# Patient Record
Sex: Male | Born: 1955
Health system: Southern US, Community
[De-identification: ages and names within clinical notes are randomized; demographics above are authoritative.]

## PROBLEM LIST (undated history)

## (undated) DIAGNOSIS — C3490 Malignant neoplasm of unspecified part of unspecified bronchus or lung: Secondary | ICD-10-CM

## (undated) DIAGNOSIS — C801 Malignant (primary) neoplasm, unspecified: Secondary | ICD-10-CM

## (undated) DIAGNOSIS — M1990 Unspecified osteoarthritis, unspecified site: Secondary | ICD-10-CM

## (undated) DIAGNOSIS — K219 Gastro-esophageal reflux disease without esophagitis: Secondary | ICD-10-CM

## (undated) DIAGNOSIS — R972 Elevated prostate specific antigen [PSA]: Secondary | ICD-10-CM

## (undated) DIAGNOSIS — Z9221 Personal history of antineoplastic chemotherapy: Secondary | ICD-10-CM

## (undated) DIAGNOSIS — J449 Chronic obstructive pulmonary disease, unspecified: Secondary | ICD-10-CM

## (undated) DIAGNOSIS — J439 Emphysema, unspecified: Secondary | ICD-10-CM

## (undated) HISTORY — PX: TONSILLECTOMY: SUR1361

## (undated) HISTORY — PX: COLONOSCOPY: SHX174

---

## 2011-05-14 ENCOUNTER — Emergency Department (INDEPENDENT_AMBULATORY_CARE_PROVIDER_SITE_OTHER): Payer: Worker's Compensation

## 2011-05-14 ENCOUNTER — Emergency Department (HOSPITAL_BASED_OUTPATIENT_CLINIC_OR_DEPARTMENT_OTHER): Payer: Worker's Compensation

## 2011-05-14 ENCOUNTER — Emergency Department (HOSPITAL_BASED_OUTPATIENT_CLINIC_OR_DEPARTMENT_OTHER)
Admission: EM | Admit: 2011-05-14 | Discharge: 2011-05-14 | Disposition: A | Discharge status: Home / Self Care | Payer: Worker's Compensation | Attending: Emergency Medicine | Admitting: Emergency Medicine

## 2011-05-14 DIAGNOSIS — Y92009 Unspecified place in unspecified non-institutional (private) residence as the place of occurrence of the external cause: Secondary | ICD-10-CM | POA: Insufficient documentation

## 2011-05-14 DIAGNOSIS — F17200 Nicotine dependence, unspecified, uncomplicated: Secondary | ICD-10-CM | POA: Insufficient documentation

## 2011-05-14 DIAGNOSIS — S02640A Fracture of ramus of mandible, unspecified side, initial encounter for closed fracture: Secondary | ICD-10-CM | POA: Insufficient documentation

## 2011-05-14 DIAGNOSIS — R51 Headache: Secondary | ICD-10-CM

## 2011-05-14 DIAGNOSIS — R220 Localized swelling, mass and lump, head: Secondary | ICD-10-CM

## 2016-11-28 DIAGNOSIS — F17200 Nicotine dependence, unspecified, uncomplicated: Secondary | ICD-10-CM | POA: Diagnosis not present

## 2016-11-28 DIAGNOSIS — R030 Elevated blood-pressure reading, without diagnosis of hypertension: Secondary | ICD-10-CM | POA: Diagnosis not present

## 2016-11-28 DIAGNOSIS — J441 Chronic obstructive pulmonary disease with (acute) exacerbation: Secondary | ICD-10-CM | POA: Diagnosis not present

## 2017-02-07 DIAGNOSIS — H5203 Hypermetropia, bilateral: Secondary | ICD-10-CM | POA: Diagnosis not present

## 2017-02-07 DIAGNOSIS — H524 Presbyopia: Secondary | ICD-10-CM | POA: Diagnosis not present

## 2017-02-07 DIAGNOSIS — H52221 Regular astigmatism, right eye: Secondary | ICD-10-CM | POA: Diagnosis not present

## 2017-07-03 ENCOUNTER — Ambulatory Visit (HOSPITAL_COMMUNITY)
Admission: EM | Admit: 2017-07-03 | Discharge: 2017-07-03 | Disposition: A | Discharge status: Home / Self Care | Payer: 59 | Attending: Family Medicine | Admitting: Family Medicine

## 2017-07-03 ENCOUNTER — Ambulatory Visit (INDEPENDENT_AMBULATORY_CARE_PROVIDER_SITE_OTHER): Payer: 59

## 2017-07-03 ENCOUNTER — Encounter (HOSPITAL_COMMUNITY): Payer: Self-pay | Admitting: *Deleted

## 2017-07-03 DIAGNOSIS — S62607A Fracture of unspecified phalanx of left little finger, initial encounter for closed fracture: Secondary | ICD-10-CM | POA: Diagnosis not present

## 2017-07-03 DIAGNOSIS — M7989 Other specified soft tissue disorders: Secondary | ICD-10-CM | POA: Diagnosis not present

## 2017-07-03 MED ORDER — HYDROCODONE-ACETAMINOPHEN 5-325 MG PO TABS: 2.0000 | ORAL_TABLET | ORAL | 0 refills | Status: DC | PRN

## 2017-07-03 NOTE — ED Provider Notes (Signed)
  [facility]   [id number] 07/03/17 Arrival Time: 3335  ASSESSMENT & PLAN:  1. Closed nondisplaced fracture of phalanx of left little finger, unspecified phalanx, initial encounter     Meds ordered this encounter  Medications  . HYDROcodone-acetaminophen (NORCO/VICODIN) 5-325 MG tablet    Sig: Take 2 tablets by mouth every 4 (four) hours as needed.    Dispense:  6 tablet    Refill:  0    Order Specific Question:   Supervising Provider    Answer:   Vanessa Kick [[id number]]   Static Splint left pinky finger. Referral to Orthopedics, call for an appointment tomorrow.  Reviewed expectations re: course of current medical issues. Questions answered. Outlined signs and symptoms indicating need for more acute intervention. Patient verbalized understanding. After Visit Summary given.   SUBJECTIVE:  Ricky Arellano is a 61 y.o. male who presents with complaint of   ROS: As per HPI.   OBJECTIVE:  Vitals:   07/03/17 1850  BP: (!) 142/76  Pulse: 82  Resp: 14  Temp: 98.6 F (37 C)  TempSrc: Oral  SpO2: 100%     General appearance: alert; no distressNeck: supple Lungs: clear to auscultation bilaterally Heart: regular rate and rhythm Abdomen: soft, non-tender; bowel sounds normal; no masses or organomegaly; no guarding or rebound tenderness MS - TTP left pinky at MIP joint Neurologic: normal symmetric reflexes; normal gait Psychological:  alert and cooperative; normal mood and affect  No results found for this or any previous visit.  Labs Reviewed - No data to display  Dg Finger Little Left  Result Date: 07/03/2017 CLINICAL DATA:  Pain and swelling EXAM: LEFT LITTLE FINGER 2+V COMPARISON:  None. FINDINGS: No gross fracture or dislocation. Tiny cortical fragment seen at the base of the middle phalanx anteriorly suggests avulsion fracture. Loss of joint space noted in the IP joints. Tiny radiodense foreign body noted in the anterior soft tissues of the finger at  about the level of the proximal phalangeal neck. IMPRESSION: No gross fracture dislocation. There is a tiny cortical fragment anterior to the base of the middle phalanx raising the question of volar plate avulsion injury. Tiny radiopaque soft tissue foreign body anteriorly in the proximal finger. Electronically Signed   By: Misty Stanley M.D.   On: 07/03/2017 19:07    No Known Allergies  PMHx, SurgHx, SocialHx, Medications, and Allergies were reviewed in the Visit Navigator and updated as appropriate.       Lysbeth Penner, [place] 07/03/17 [phone]

## 2017-07-03 NOTE — ED Triage Notes (Signed)
PT  BENT  L  PINKY  BACK   WOKE  UP   NEXT  AM  WITH  SWELLING  AND  DECREASED      ROM

## 2017-07-10 DIAGNOSIS — H04123 Dry eye syndrome of bilateral lacrimal glands: Secondary | ICD-10-CM | POA: Diagnosis not present

## 2017-07-10 DIAGNOSIS — H2513 Age-related nuclear cataract, bilateral: Secondary | ICD-10-CM | POA: Diagnosis not present

## 2017-07-17 DIAGNOSIS — M79645 Pain in left finger(s): Secondary | ICD-10-CM | POA: Diagnosis not present

## 2017-07-17 DIAGNOSIS — M25649 Stiffness of unspecified hand, not elsewhere classified: Secondary | ICD-10-CM | POA: Diagnosis not present

## 2017-11-21 DIAGNOSIS — C3490 Malignant neoplasm of unspecified part of unspecified bronchus or lung: Secondary | ICD-10-CM

## 2017-11-21 HISTORY — DX: Malignant neoplasm of unspecified part of unspecified bronchus or lung: C34.90

## 2018-01-26 ENCOUNTER — Encounter (HOSPITAL_COMMUNITY): Payer: Self-pay | Admitting: Emergency Medicine

## 2018-01-26 ENCOUNTER — Ambulatory Visit (HOSPITAL_COMMUNITY)
Admission: EM | Admit: 2018-01-26 | Discharge: 2018-01-26 | Disposition: A | Discharge status: Home / Self Care | Payer: 59 | Attending: Internal Medicine | Admitting: Internal Medicine

## 2018-01-26 DIAGNOSIS — R69 Illness, unspecified: Secondary | ICD-10-CM | POA: Diagnosis not present

## 2018-01-26 DIAGNOSIS — J111 Influenza due to unidentified influenza virus with other respiratory manifestations: Secondary | ICD-10-CM | POA: Diagnosis not present

## 2018-01-26 MED ORDER — IPRATROPIUM BROMIDE 0.06 % NA SOLN: 2.0000 | Freq: Four times a day (QID) | NASAL | 0 refills | Status: DC

## 2018-01-26 MED ORDER — HYDROCOD POLST-CPM POLST ER 10-8 MG/5ML PO SUER: 5.0000 mL | Freq: Every evening | ORAL | 0 refills | Status: DC | PRN

## 2018-01-26 MED ORDER — BENZONATATE 100 MG PO CAPS: 100.0000 mg | ORAL_CAPSULE | Freq: Three times a day (TID) | ORAL | 0 refills | Status: DC

## 2018-01-26 MED ORDER — FLUTICASONE PROPIONATE 50 MCG/ACT NA SUSP: 2.0000 | Freq: Every day | NASAL | 0 refills | Status: DC

## 2018-01-26 NOTE — ED Provider Notes (Signed)
[facility]    CSN: [id number] Arrival date & time: 01/26/18  1051     History   Chief Complaint Chief Complaint  Patient presents with  . Fever  . Cough    HPI Ricky Arellano is a 62 y.o. male.   62 year old male comes in for 3 day history of flulike symptoms.  States has had fever, joint pain, productive cough, sore throat, rhinorrhea, nasal congestion. Subjective fever with chills. otc tylenol, motrin, cold medicine without relief. Last dose of antipyretic this morning. Chest soreness when coughing. Denies shortness of breath, wheezing. Current every day smoker, 1ppd, states smoking for more than 40 years.       History reviewed. No pertinent past medical history.  There are no active problems to display for this patient.   History reviewed. No pertinent surgical history.     Home Medications    Prior to Admission medications   Medication Sig Start Date End Date Taking? Authorizing Provider  benzonatate (TESSALON) 100 MG capsule Take 1 capsule (100 mg total) by mouth every 8 (eight) hours. 01/26/18   Tasia Catchings, Carsin Randazzo V, PA-C  chlorpheniramine-HYDROcodone (TUSSIONEX PENNKINETIC ER) 10-8 MG/5ML SUER Take 5 mLs by mouth at bedtime as needed for cough. 01/26/18   Tasia Catchings, Jailan Trimm V, PA-C  fluticasone (FLONASE) 50 MCG/ACT nasal spray Place 2 sprays into both nostrils daily. 01/26/18   Tasia Catchings, Lotta [place]enfield V, PA-C  ipratropium (ATROVENT) 0.06 % nasal spray Place 2 sprays into both nostrils 4 (four) times daily. 01/26/18   Ok Edwards, PA-C    Family History No family history on file.  Social History Social History   Tobacco Use  . Smoking status: Current Some Day Smoker  . Smokeless tobacco: Never Used  Substance Use Topics  . Alcohol use: No  . Drug use: No     Allergies   Patient has no known allergies.   Review of Systems Review of Systems  Reason unable to perform ROS: See HPI as above.     Physical Exam Triage Vital Signs ED Triage Vitals  Enc Vitals Group     BP 01/26/18  1127 130/73     Pulse Rate 01/26/18 1127 (!) 110     Resp 01/26/18 1127 18     Temp 01/26/18 1127 99.7 F (37.6 C)     Temp src --      SpO2 01/26/18 1127 93 %     Weight --      Height --      Head Circumference --      Peak Flow --      Pain Score 01/26/18 1129 8     Pain Loc --      Pain Edu? --      Excl. in [place]? --    No data found.  Updated Vital Signs BP 130/73   Pulse (!) 110   Temp 99.7 F (37.6 C)   Resp 18   SpO2 93%   Physical Exam  Constitutional: He is oriented to person, place, and time. He appears well-developed and well-nourished. No distress.  HENT:  Head: Normocephalic and atraumatic.  Right Ear: Tympanic membrane, external ear and ear canal normal. Tympanic membrane is not erythematous and not bulging.  Left Ear: Tympanic membrane, external ear and ear canal normal. Tympanic membrane is not erythematous and not bulging.  Nose: Mucosal edema and rhinorrhea present. Right sinus exhibits maxillary sinus tenderness. Right sinus exhibits no frontal sinus tenderness. Left sinus exhibits maxillary sinus tenderness.  Left sinus exhibits no frontal sinus tenderness.  Mouth/Throat: Uvula is midline, oropharynx is clear and moist and mucous membranes are normal. No tonsillar exudate.  Eyes: Conjunctivae are normal. Pupils are equal, round, and reactive to light.  Neck: Normal range of motion. Neck supple.  Cardiovascular: Normal rate, regular rhythm and normal heart sounds. Exam reveals no gallop and no friction rub.  No murmur heard. Pulmonary/Chest: Effort normal and breath sounds normal. No stridor. He has no decreased breath sounds. He has no wheezes. He has no rhonchi. He has no rales.  Lymphadenopathy:    He has no cervical adenopathy.  Neurological: He is alert and oriented to person, place, and time.  Skin: Skin is warm and dry.  Psychiatric: He has a normal mood and affect. His behavior is normal. Judgment normal.    UC Treatments / Results  Labs (all  labs ordered are listed, but only abnormal results are displayed) Labs Reviewed - No data to display  EKG  EKG Interpretation None       Radiology No results found.  Procedures Procedures (including critical care time)  Medications Ordered in UC Medications - No data to display   Initial Impression / Assessment and Plan / UC Course  I have reviewed the triage vital signs and the nursing notes.  Pertinent labs & imaging results that were available during my care of the patient were reviewed by me and considered in my medical decision making (see chart for details).    Patient with flulike symptoms, outside of range for Tamiflu.  Will provide symptomatic treatment as needed. Push fluids. Return precautions given.    Final Clinical Impressions(s) / UC Diagnoses   Final diagnoses:  Influenza-like illness    ED Discharge Orders        Ordered    fluticasone (FLONASE) 50 MCG/ACT nasal spray  Daily     01/26/18 1228    ipratropium (ATROVENT) 0.06 % nasal spray  4 times daily     01/26/18 1228    benzonatate (TESSALON) 100 MG capsule  Every 8 hours     01/26/18 1228    chlorpheniramine-HYDROcodone (TUSSIONEX PENNKINETIC ER) 10-8 MG/5ML SUER  At bedtime PRN     01/26/18 1228       Controlled Substance Prescriptions [place] Controlled Substance Registry consulted? Yes, I have consulted the [place] Controlled Substances Registry for this patient, and feel the risk/benefit ratio today is favorable for proceeding with this prescription for a controlled substance.   Ok Edwards, PA-C 01/26/18 1234

## 2018-01-26 NOTE — ED Triage Notes (Signed)
Pt c/o fever, joints achy, flu symptoms for several days. Pt taking tylenol and ibuprofen for symptoms. Coughing.

## 2018-01-26 NOTE — Discharge Instructions (Signed)
Tessalon for cough. Tussionex at night for cough. Start flonase, atrovent nasal spray for nasal congestion/drainage. You can use over the counter nasal saline rinse such as neti pot for nasal congestion. Keep hydrated, your urine should be clear to pale yellow in color. Tylenol/motrin for fever and pain. Monitor for any worsening of symptoms, chest pain, shortness of breath, wheezing, swelling of the throat, follow up for reevaluation.   For sore throat try using a honey-based tea. Use 3 teaspoons of honey with juice squeezed from half lemon. Place shaved pieces of ginger into 1/2-1 cup of water and warm over stove top. Then mix the ingredients and repeat every 4 hours as needed.

## 2018-08-06 ENCOUNTER — Ambulatory Visit (INDEPENDENT_AMBULATORY_CARE_PROVIDER_SITE_OTHER): Payer: 59

## 2018-08-06 ENCOUNTER — Other Ambulatory Visit: Payer: Self-pay

## 2018-08-06 ENCOUNTER — Encounter (HOSPITAL_COMMUNITY): Payer: Self-pay | Admitting: Emergency Medicine

## 2018-08-06 ENCOUNTER — Ambulatory Visit (HOSPITAL_COMMUNITY)
Admission: EM | Admit: 2018-08-06 | Discharge: 2018-08-06 | Disposition: A | Discharge status: Home / Self Care | Payer: 59 | Attending: Family Medicine | Admitting: Family Medicine

## 2018-08-06 DIAGNOSIS — R9389 Abnormal findings on diagnostic imaging of other specified body structures: Secondary | ICD-10-CM

## 2018-08-06 DIAGNOSIS — J449 Chronic obstructive pulmonary disease, unspecified: Secondary | ICD-10-CM | POA: Diagnosis not present

## 2018-08-06 DIAGNOSIS — J181 Lobar pneumonia, unspecified organism: Secondary | ICD-10-CM

## 2018-08-06 DIAGNOSIS — J189 Pneumonia, unspecified organism: Secondary | ICD-10-CM

## 2018-08-06 MED ORDER — AZITHROMYCIN 250 MG PO TABS: 250.0000 mg | ORAL_TABLET | Freq: Every day | ORAL | 0 refills | Status: AC

## 2018-08-06 MED ORDER — HYDROCOD POLST-CPM POLST ER 10-8 MG/5ML PO SUER
5.0000 mL | Freq: Every evening | ORAL | 0 refills | Status: AC | PRN
Start: 2018-08-06 — End: ?

## 2018-08-06 NOTE — Discharge Instructions (Addendum)
You appear to have a pneumonia.    Please return in two weeks for a follow up x-ray to make sure the infection has cleared.

## 2018-08-06 NOTE — ED Provider Notes (Signed)
[facility]    CSN: [id number] Arrival date & time: 08/06/18  1754     History   Chief Complaint Chief Complaint  Patient presents with  . Chills    HPI [place] SHANDELL JALLOW is a 62 y.o. male.   Fever, chills, body aches for a week.  Chills were most prominent the last 24 hours.  Patient is a smoker.  He is not short of breath.  Significantly, he has had no cough.  Left chest pain intermittently.    Patient works in [employer].     History reviewed. No pertinent past medical history.  There are no active problems to display for this patient.   History reviewed. No pertinent surgical history.     Home Medications    Prior to Admission medications   Medication Sig Start Date End Date Taking? Authorizing Provider  acetaminophen (TYLENOL) 325 MG tablet Take 650 mg by mouth every 6 (six) hours as needed.   Yes [provider]  ibuprofen (ADVIL,MOTRIN) 200 MG tablet Take 200 mg by mouth every 6 (six) hours as needed.   Yes [provider]  azithromycin (ZITHROMAX) 250 MG tablet Take 1 tablet (250 mg total) by mouth daily. Take first 2 tablets together, then 1 every day until finished. 08/06/18   Robyn Haber, MD  chlorpheniramine-HYDROcodone (TUSSIONEX PENNKINETIC ER) 10-8 MG/5ML SUER Take 5 mLs by mouth at bedtime as needed for cough. 08/06/18   Robyn Haber, MD    Family History History reviewed. No pertinent family history.  Social History Social History   Tobacco Use  . Smoking status: Current Some Day Smoker  . Smokeless tobacco: Never Used  Substance Use Topics  . Alcohol use: No  . Drug use: No     Allergies   Patient has no known allergies.   Review of Systems Review of Systems  Constitutional: Positive for chills.  HENT: Positive for congestion.   Respiratory: Positive for chest tightness. Negative for cough and wheezing.   Gastrointestinal: Negative.   Musculoskeletal: Positive for myalgias.  Neurological:  Negative.   Psychiatric/Behavioral: Negative.      Physical Exam Triage Vital Signs ED Triage Vitals  Enc Vitals Group     BP 08/06/18 1839 139/68     Pulse Rate 08/06/18 1839 82     Resp 08/06/18 1839 18     Temp 08/06/18 1839 98.6 F (37 C)     Temp Source 08/06/18 1839 Oral     SpO2 08/06/18 1839 97 %     Weight --      Height --      Head Circumference --      Peak Flow --      Pain Score 08/06/18 1835 0     Pain Loc --      Pain Edu? --      Excl. in [place]? --    No data found.  Updated Vital Signs BP 139/68 (BP Location: Right Arm)   Pulse 82   Temp 98.6 F (37 C) (Oral)   Resp 18   SpO2 97%    Physical Exam  Constitutional: He is oriented to person, place, and time. He appears well-developed and well-nourished.  HENT:  Right Ear: External ear normal.  Left Ear: External ear normal.  Mouth/Throat: Oropharynx is clear and moist.  Eyes: Pupils are equal, round, and reactive to light. Conjunctivae are normal.  Neck: Normal range of motion. Neck supple.  Cardiovascular: Normal rate and regular rhythm.  Pulmonary/Chest:  Effort normal. He has rales.  Left basilar rales  Musculoskeletal: Normal range of motion.  Neurological: He is alert and oriented to person, place, and time.  Skin: Skin is warm and dry.  Nursing note and vitals reviewed.    UC Treatments / Results  Labs (all labs ordered are listed, but only abnormal results are displayed) Labs Reviewed - No data to display  EKG None  Radiology No results found.  Procedures Procedures (including critical care time)  Medications Ordered in UC Medications - No data to display  Initial Impression / Assessment and Plan / UC Course  I have reviewed the triage vital signs and the nursing notes.  Pertinent labs & imaging results that were available during my care of the patient were reviewed by me and considered in my medical decision making (see chart for details).    Final Clinical Impressions(s)  / UC Diagnoses   Final diagnoses:  Community acquired pneumonia of left lower lobe of lung ([place])  Abnormal chest x-ray     Discharge Instructions     You appear to have a pneumonia.    Please return in two weeks for a follow up x-ray to make sure the infection has cleared.    ED Prescriptions    Medication Sig Dispense Auth. Provider   chlorpheniramine-HYDROcodone (TUSSIONEX PENNKINETIC ER) 10-8 MG/5ML SUER Take 5 mLs by mouth at bedtime as needed for cough. 60 mL Robyn Haber, MD   azithromycin (ZITHROMAX) 250 MG tablet Take 1 tablet (250 mg total) by mouth daily. Take first 2 tablets together, then 1 every day until finished. 6 tablet Robyn Haber, MD     Controlled Substance Prescriptions [place] Controlled Substance Registry consulted? Not Applicable   Robyn Haber, MD 08/06/18 1911

## 2018-08-06 NOTE — ED Triage Notes (Signed)
Fever, chills, body aches for a week.    Left chest pain intermittently.

## 2018-09-03 ENCOUNTER — Ambulatory Visit (INDEPENDENT_AMBULATORY_CARE_PROVIDER_SITE_OTHER): Payer: 59

## 2018-09-03 ENCOUNTER — Ambulatory Visit (HOSPITAL_COMMUNITY)
Admission: EM | Admit: 2018-09-03 | Discharge: 2018-09-03 | Disposition: A | Discharge status: Home / Self Care | Payer: 59 | Attending: Family Medicine | Admitting: Family Medicine

## 2018-09-03 ENCOUNTER — Other Ambulatory Visit: Payer: Self-pay

## 2018-09-03 ENCOUNTER — Encounter (HOSPITAL_COMMUNITY): Payer: Self-pay | Admitting: Emergency Medicine

## 2018-09-03 DIAGNOSIS — R0981 Nasal congestion: Secondary | ICD-10-CM

## 2018-09-03 DIAGNOSIS — R05 Cough: Secondary | ICD-10-CM

## 2018-09-03 DIAGNOSIS — Z720 Tobacco use: Secondary | ICD-10-CM | POA: Diagnosis not present

## 2018-09-03 DIAGNOSIS — Z0000 Encounter for general adult medical examination without abnormal findings: Secondary | ICD-10-CM

## 2018-09-03 DIAGNOSIS — J181 Lobar pneumonia, unspecified organism: Secondary | ICD-10-CM

## 2018-09-03 NOTE — ED Triage Notes (Signed)
Pt here for follow up xray for pneumonia he was diagnosed with last month.  Pt reports feeling better, with just some sinus congestion and cough.

## 2018-09-03 NOTE — Discharge Instructions (Signed)
Your x ray was improved from previous.  You can try Flonase nasal spray over the counter for nasal congestion and sinus pressure.  Follow up with your doctor for possibly CT scan.

## 2018-09-03 NOTE — ED Provider Notes (Signed)
[name]    CSN: [id number] Arrival date & time: 09/03/18  1134     History   Chief Complaint Chief Complaint  Patient presents with  . Follow-up  . Repeat XRay    HPI Ricky Arellano is a 62 y.o. male.   Pt is a 62 year old male that presents for recheck of pneumonia. He was seen here and treated for PNA on the 16th of September. He since has had improvement in symptoms. His only complaint today is some nasal congestion that started 2 days ago.  He still has a mild cough but not as severe as it was previously.  He is currently not take any medicines for his symptoms.  He denies any fever, chills, body aches, chest pain, shortness of breath or palpitations.  ROS per HPI      History reviewed. No pertinent past medical history.  There are no active problems to display for this patient.   History reviewed. No pertinent surgical history.     Home Medications    Prior to Admission medications   Medication Sig Start Date End Date Taking? Authorizing Provider  acetaminophen (TYLENOL) 325 MG tablet Take 650 mg by mouth every 6 (six) hours as needed.   Yes [provider]  chlorpheniramine-HYDROcodone (TUSSIONEX PENNKINETIC ER) 10-8 MG/5ML SUER Take 5 mLs by mouth at bedtime as needed for cough. 08/06/18  Yes Robyn Haber, MD  ibuprofen (ADVIL,MOTRIN) 200 MG tablet Take 200 mg by mouth every 6 (six) hours as needed.   Yes [provider]  azithromycin (ZITHROMAX) 250 MG tablet Take 1 tablet (250 mg total) by mouth daily. Take first 2 tablets together, then 1 every day until finished. 08/06/18   Robyn Haber, MD    Family History History reviewed. No pertinent family history.  Social History Social History   Tobacco Use  . Smoking status: Current Some Day Smoker  . Smokeless tobacco: Never Used  Substance Use Topics  . Alcohol use: No  . Drug use: No     Allergies   Patient has no known allergies.   Review of  Systems Review of Systems   Physical Exam Triage Vital Signs ED Triage Vitals  Enc Vitals Group     BP 09/03/18 1218 (!) 147/69     Pulse Rate 09/03/18 1218 80     Resp --      Temp 09/03/18 1218 98 F (36.7 C)     Temp Source 09/03/18 1218 Oral     SpO2 09/03/18 1218 96 %     Weight --      Height --      Head Circumference --      Peak Flow --      Pain Score 09/03/18 1219 0     Pain Loc --      Pain Edu? --      Excl. in [place]? --    No data found.  Updated Vital Signs BP (!) 147/69 (BP Location: Left Arm)   Pulse 80   Temp 98 F (36.7 C) (Oral)   SpO2 96%   Visual Acuity Right Eye Distance:   Left Eye Distance:   Bilateral Distance:    Right Eye Near:   Left Eye Near:    Bilateral Near:     Physical Exam  Constitutional: He is oriented to person, place, and time. He appears well-developed and well-nourished.  Very pleasant. Non toxic or ill appearing.   HENT:  Head: Normocephalic  and atraumatic.  Right Ear: External ear normal.  Left Ear: External ear normal.  Bilateral TMs normal.  External ears normal.  Without posterior oropharyngeal erythema, tonsillar swelling or exudates. No lesions.  Mild nasal turbinate swelling.  No lymphadenopathy.   Eyes: Conjunctivae are normal.  Neck: Normal range of motion.  Cardiovascular: Normal rate, regular rhythm and normal heart sounds.  Pulmonary/Chest: Effort normal and breath sounds normal.  Lungs clear in all fields. No dyspnea or distress. No retractions or nasal flaring.   Musculoskeletal: Normal range of motion.  Neurological: He is alert and oriented to person, place, and time.  Skin: Skin is warm and dry.  Psychiatric: He has a normal mood and affect.  Nursing note and vitals reviewed.    UC Treatments / Results  Labs (all labs ordered are listed, but only abnormal results are displayed) Labs Reviewed - No data to display  EKG None  Radiology Dg Chest 2 View  Result Date: 09/03/2018 CLINICAL  DATA:  Recent pneumonia, follow-up, still has some nasal congestion and cough, smoker, COPD EXAM: CHEST - 2 VIEW COMPARISON:  08/06/2018 FINDINGS: Normal heart size, mediastinal contours, and pulmonary vascularity. Significant chronic interstitial lung disease with an area of persistent density versus nodularity in the RIGHT upper lobe. Improved LEFT lung infiltrate. Underlying emphysematous changes. Persistent more focal interstitial changes at the medial RIGHT lung base. No pleural effusion or pneumothorax. Bones unremarkable. IMPRESSION: Improved LEFT lung pneumonia with persistent chronic interstitial lung disease changes and underlying emphysematous changes in both lungs. Persistent area of questionable nodularity versus scarring in the RIGHT upper lobe; unless patient has prior outside chest radiographs which can be obtained to confirm stability of this finding, recommend CT imaging to exclude pulmonary neoplasm. Electronically Signed   By: Lavonia Dana M.D.   On: 09/03/2018 12:51    Procedures Procedures (including critical care time)  Medications Ordered in UC Medications - No data to display  Initial Impression / Assessment and Plan / UC Course  I have reviewed the triage vital signs and the nursing notes.  Pertinent labs & imaging results that were available during my care of the patient were reviewed by me and considered in my medical decision making (see chart for details).     X-ray revealed improving left lung pneumonia. Worrisome for right upper lobe scarring versus lung nodule. Recommend CT scan for further evaluation to rule out neoplasm. Patient instructed to call his primary care provider and have them order an outpatient CT scan. Understanding and agreed to plan.  Flonase nasal spray for nasal congestion an inflammation.       Final Clinical Impressions(s) / UC Diagnoses   Final diagnoses:  Regular check-up     Discharge Instructions     Your x ray was improved  from previous.  You can try Flonase nasal spray over the counter for nasal congestion and sinus pressure.  Follow up with your doctor for possibly CT scan.     ED Prescriptions    None     Controlled Substance Prescriptions [place] Controlled Substance Registry consulted? Not Applicable   Orvan July, NP 09/03/18 1439

## 2018-11-05 DIAGNOSIS — H524 Presbyopia: Secondary | ICD-10-CM | POA: Diagnosis not present

## 2018-11-05 DIAGNOSIS — H52223 Regular astigmatism, bilateral: Secondary | ICD-10-CM | POA: Diagnosis not present

## 2018-11-05 DIAGNOSIS — H5203 Hypermetropia, bilateral: Secondary | ICD-10-CM | POA: Diagnosis not present

## 2019-01-05 ENCOUNTER — Emergency Department (HOSPITAL_BASED_OUTPATIENT_CLINIC_OR_DEPARTMENT_OTHER)
Admission: EM | Admit: 2019-01-05 | Discharge: 2019-01-05 | Disposition: A | Discharge status: Home / Self Care | Payer: 59 | Attending: Emergency Medicine | Admitting: Emergency Medicine

## 2019-01-05 ENCOUNTER — Other Ambulatory Visit: Payer: Self-pay

## 2019-01-05 ENCOUNTER — Encounter (HOSPITAL_BASED_OUTPATIENT_CLINIC_OR_DEPARTMENT_OTHER): Payer: Self-pay | Admitting: Emergency Medicine

## 2019-01-05 DIAGNOSIS — K5900 Constipation, unspecified: Secondary | ICD-10-CM | POA: Diagnosis not present

## 2019-01-05 DIAGNOSIS — F17200 Nicotine dependence, unspecified, uncomplicated: Secondary | ICD-10-CM | POA: Insufficient documentation

## 2019-01-05 DIAGNOSIS — R339 Retention of urine, unspecified: Secondary | ICD-10-CM | POA: Insufficient documentation

## 2019-01-05 DIAGNOSIS — R338 Other retention of urine: Secondary | ICD-10-CM

## 2019-01-05 LAB — BASIC METABOLIC PANEL
Anion gap: 12 (ref 5–15)
BUN: 13 mg/dL (ref 8–23)
CALCIUM: 8.9 mg/dL (ref 8.9–10.3)
CO2: 21 mmol/L — ABNORMAL LOW (ref 22–32)
Chloride: 100 mmol/L (ref 98–111)
Creatinine, Ser: 0.91 mg/dL (ref 0.61–1.24)
Glucose, Bld: 112 mg/dL — ABNORMAL HIGH (ref 70–99)
Potassium: 4.2 mmol/L (ref 3.5–5.1)
Sodium: 133 mmol/L — ABNORMAL LOW (ref 135–145)

## 2019-01-05 LAB — URINALYSIS, ROUTINE W REFLEX MICROSCOPIC
Bilirubin Urine: NEGATIVE
Glucose, UA: NEGATIVE mg/dL
KETONES UR: NEGATIVE mg/dL
Leukocytes,Ua: NEGATIVE
NITRITE: NEGATIVE
PROTEIN: NEGATIVE mg/dL
Specific Gravity, Urine: 1.02 (ref 1.005–1.030)
pH: 6 (ref 5.0–8.0)

## 2019-01-05 LAB — URINALYSIS, MICROSCOPIC (REFLEX): Squamous Epithelial / LPF: NONE SEEN (ref 0–5)

## 2019-01-05 NOTE — ED Notes (Signed)
ED PA in for rectal exam with EMT

## 2019-01-05 NOTE — ED Triage Notes (Signed)
Pt reports he has not urinated since 11am.

## 2019-01-05 NOTE — ED Provider Notes (Signed)
[facility] Provider Note   CSN: [id number] Arrival date & time: 01/05/19  1735     History   Chief Complaint Chief Complaint  Patient presents with  . Urinary Retention    HPI Ricky Arellano is a 63 y.o. male.  Patient presents with c/o urinary retention --last urination was 11 AM today.  Patient does not have a history of UTI or prostate problems.  He complains of sensation of needing to urinate and lower abdominal cramping.  Patient reports taking Benadryl last night for runny nose.  No fevers, nausea, vomiting.  Patient has recently had nonbloody diarrhea and then today has been more constipated.  He has some rectal pain associated with hemorrhoids.  Onset of symptoms gradual.  Course is worsening.  Nothing makes symptoms better or worse.     History reviewed. No pertinent past medical history.  There are no active problems to display for this patient.   Past Surgical History:  Procedure Laterality Date  . TONSILLECTOMY          Home Medications    Prior to Admission medications   Medication Sig Start Date End Date Taking? Authorizing Provider  acetaminophen (TYLENOL) 325 MG tablet Take 650 mg by mouth every 6 (six) hours as needed.    [provider]  azithromycin (ZITHROMAX) 250 MG tablet Take 1 tablet (250 mg total) by mouth daily. Take first 2 tablets together, then 1 every day until finished. 08/06/18   Robyn Haber, MD  chlorpheniramine-HYDROcodone (TUSSIONEX PENNKINETIC ER) 10-8 MG/5ML SUER Take 5 mLs by mouth at bedtime as needed for cough. 08/06/18   Robyn Haber, MD  ibuprofen (ADVIL,MOTRIN) 200 MG tablet Take 200 mg by mouth every 6 (six) hours as needed.    [provider]    Family History No family history on file.  Social History Social History   Tobacco Use  . Smoking status: Current Some Day Smoker  . Smokeless tobacco: Never Used  Substance Use Topics  . Alcohol use: No  . Drug use:  No     Allergies   Patient has no known allergies.   Review of Systems Review of Systems  Constitutional: Negative for fever.  HENT: Negative for rhinorrhea and sore throat.   Eyes: Negative for redness.  Respiratory: Negative for cough.   Cardiovascular: Negative for chest pain.  Gastrointestinal: Positive for abdominal pain. Negative for diarrhea, nausea and vomiting.  Genitourinary: Positive for decreased urine volume and difficulty urinating. Negative for dysuria, flank pain, hematuria, testicular pain and urgency.  Musculoskeletal: Negative for myalgias.  Skin: Negative for rash.  Neurological: Negative for headaches.     Physical Exam Updated Vital Signs BP (!) 193/98 (BP Location: Right Arm)   Pulse (!) 117   Temp 98.2 F (36.8 C) (Oral)   Resp (!) 22   Ht 5\' 7"  (1.702 m)   Wt 70.3 kg   SpO2 99%   BMI 24.28 kg/m   Physical Exam Vitals signs and nursing note reviewed.  Constitutional:      General: He is in acute distress.     Appearance: He is well-developed.     Comments: Patient is uncomfortable appearing  HENT:     Head: Normocephalic and atraumatic.  Eyes:     General:        Right eye: No discharge.        Left eye: No discharge.     Conjunctiva/sclera: Conjunctivae normal.  Neck:  Musculoskeletal: Normal range of motion and neck supple.  Cardiovascular:     Rate and Rhythm: Normal rate and regular rhythm.     Heart sounds: Normal heart sounds.  Pulmonary:     Effort: Pulmonary effort is normal.     Breath sounds: Normal breath sounds.  Abdominal:     General: There is distension.     Palpations: Abdomen is soft.     Tenderness: There is abdominal tenderness (Suprapubic, moderate).  Genitourinary:    Rectum: Tenderness and external hemorrhoid (inflammed, nonthrombosed) present.  Skin:    General: Skin is warm and dry.  Neurological:     Mental Status: He is alert.      ED Treatments / Results  Labs (all labs ordered are listed,  but only abnormal results are displayed) Labs Reviewed  BASIC METABOLIC PANEL - Abnormal; Notable for the following components:      Result Value   Sodium 133 (*)    CO2 21 (*)    Glucose, Bld 112 (*)    All other components within normal limits  URINALYSIS, ROUTINE W REFLEX MICROSCOPIC - Abnormal; Notable for the following components:   Hgb urine dipstick TRACE (*)    All other components within normal limits  URINALYSIS, MICROSCOPIC (REFLEX) - Abnormal; Notable for the following components:   Bacteria, UA RARE (*)    All other components within normal limits    EKG None  Radiology No results found.  Procedures Procedures (including critical care time)  Medications Ordered in ED Medications - No data to display   Initial Impression / Assessment and Plan / ED Course  I have reviewed the triage vital signs and the nursing notes.  Pertinent labs & imaging results that were available during my care of the patient were reviewed by me and considered in my medical decision making (see chart for details).     Patient seen and examined.  Bladder scan shows 754 cc of urine.  Vital signs reviewed and are as follows: BP (!) 193/98 (BP Location: Right Arm)   Pulse (!) 117   Temp 98.2 F (36.8 C) (Oral)   Resp (!) 22   Ht 5\' 7"  (1.702 m)   Wt 70.3 kg   SpO2 99%   BMI 24.28 kg/m   6:31 PM patient feels much better after Foley placement.  Attempted rectal exam given reported constipation and possible impaction.  Patient has an inflamed external hemorrhoid and is extremely tender.  He is unable to tolerate exam.  Creatinine is normal.  UA without signs of infection.  At this point, will leave Foley catheter in place and give patient appropriate urology follow-up.  He will continue laxatives and stool softeners.  Discussed avoidance of antihistamine medications.  Encouraged return with any worsening symptoms or other concerns.  The patient was urged to return to the Emergency  Department immediately with worsening of current symptoms, worsening abdominal pain, persistent vomiting, blood noted in stools, fever, or any other concerns. The patient verbalized understanding.   BP (!) 141/73 (BP Location: Left Arm)   Pulse 95   Temp 98.2 F (36.8 C) (Oral)   Resp 16   Ht 5\' 7"  (1.702 m)   Wt 70.3 kg   SpO2 97%   BMI 24.28 kg/m   Final Clinical Impressions(s) / ED Diagnoses   Final diagnoses:  Acute urinary retention  Constipation, unspecified constipation type   Urine retention: Likely multifactorial due to use of antihistamines and also rectal pain due to  inflamed hemorrhoid causing constipation.  Patient will avoid these medications, perform sitz baths and use over-the-counter laxatives to help with his constipation.  Abdomen is soft and nontender.  Symptoms are controlled after placement of Foley catheter which will be left in place until he can have a voiding trial performed at urology.  Referral given.   ED Discharge Orders    None       Carlisle Cater, Hershal Coria 01/05/19 1941    Sherwood Gambler, MD 01/07/19 [phone]

## 2019-01-05 NOTE — Discharge Instructions (Signed)
Please read and follow all provided instructions.  Your diagnoses today include:  1. Acute urinary retention   2. Constipation, unspecified constipation type     Tests performed today include:  Kidney function - normal  Urine test - no urine infection  Vital signs. See below for your results today.   Medications prescribed:   None  Take any prescribed medications only as directed.  Home care instructions:  Follow any educational materials contained in this packet.  Sit in a tub of warm water twice a day to help rectal pain due to hemorrhoid.  Avoid antihistamine and cold medications as this can worsen urinary retention.  Follow-up instructions: Call the urologist on Monday to schedule appointment for a follow-up.  Return instructions:   Please return to the Emergency Department if you experience worsening symptoms.   Please return if you have any other emergent concerns.  Additional Information:  Your vital signs today were: BP (!) 141/73 (BP Location: Left Arm)    Pulse 95    Temp 98.2 F (36.8 C) (Oral)    Resp 16    Ht 5\' 7"  (1.702 m)    Wt 70.3 kg    SpO2 97%    BMI 24.28 kg/m  If your blood pressure (BP) was elevated above 135/85 this visit, please have this repeated by your doctor within one month. --------------

## 2019-01-16 DIAGNOSIS — N401 Enlarged prostate with lower urinary tract symptoms: Secondary | ICD-10-CM | POA: Diagnosis not present

## 2019-01-16 DIAGNOSIS — R338 Other retention of urine: Secondary | ICD-10-CM | POA: Diagnosis not present

## 2019-05-29 ENCOUNTER — Emergency Department (HOSPITAL_BASED_OUTPATIENT_CLINIC_OR_DEPARTMENT_OTHER): Payer: 59

## 2019-05-29 ENCOUNTER — Other Ambulatory Visit: Payer: Self-pay

## 2019-05-29 ENCOUNTER — Emergency Department (HOSPITAL_BASED_OUTPATIENT_CLINIC_OR_DEPARTMENT_OTHER)
Admission: EM | Admit: 2019-05-29 | Discharge: 2019-05-29 | Disposition: A | Discharge status: Home / Self Care | Payer: 59 | Attending: Emergency Medicine | Admitting: Emergency Medicine

## 2019-05-29 ENCOUNTER — Encounter (HOSPITAL_BASED_OUTPATIENT_CLINIC_OR_DEPARTMENT_OTHER): Payer: Self-pay

## 2019-05-29 DIAGNOSIS — R05 Cough: Secondary | ICD-10-CM | POA: Insufficient documentation

## 2019-05-29 DIAGNOSIS — F17200 Nicotine dependence, unspecified, uncomplicated: Secondary | ICD-10-CM | POA: Insufficient documentation

## 2019-05-29 DIAGNOSIS — R071 Chest pain on breathing: Secondary | ICD-10-CM | POA: Diagnosis not present

## 2019-05-29 DIAGNOSIS — R0789 Other chest pain: Secondary | ICD-10-CM | POA: Insufficient documentation

## 2019-05-29 DIAGNOSIS — R079 Chest pain, unspecified: Secondary | ICD-10-CM | POA: Diagnosis not present

## 2019-05-29 LAB — TROPONIN I (HIGH SENSITIVITY)
Troponin I (High Sensitivity): 5 ng/L (ref <18)
Troponin I (High Sensitivity): 4 ng/L (ref <18)

## 2019-05-29 LAB — CBC
HCT: 51.4 % (ref 39.0–52.0)
Hemoglobin: 15.8 g/dL (ref 13.0–17.0)
MCH: 25.7 pg — ABNORMAL LOW (ref 26.0–34.0)
MCHC: 30.7 g/dL (ref 30.0–36.0)
MCV: 83.6 fL (ref 80.0–100.0)
Platelets: 269 10*3/uL (ref 150–400)
RBC: 6.15 MIL/uL — ABNORMAL HIGH (ref 4.22–5.81)
RDW: 18.1 % — ABNORMAL HIGH (ref 11.5–15.5)
WBC: 5.9 10*3/uL (ref 4.0–10.5)
nRBC: 0 % (ref 0.0–0.2)

## 2019-05-29 LAB — BASIC METABOLIC PANEL
Anion gap: 8 (ref 5–15)
BUN: 13 mg/dL (ref 8–23)
CO2: 26 mmol/L (ref 22–32)
Calcium: 9 mg/dL (ref 8.9–10.3)
Chloride: 105 mmol/L (ref 98–111)
Creatinine, Ser: 0.92 mg/dL (ref 0.61–1.24)
GFR calc Af Amer: >60 mL/min (ref >60)
GFR calc non Af Amer: >60 mL/min (ref >60)
Glucose, Bld: 101 mg/dL — ABNORMAL HIGH (ref 70–99)
Potassium: 3.5 mmol/L (ref 3.5–5.1)
Sodium: 139 mmol/L (ref 135–145)

## 2019-05-29 MED ORDER — KETOROLAC TROMETHAMINE 30 MG/ML IJ SOLN
30.0000 mg | Freq: Once | INTRAMUSCULAR | Status: AC
  Administered 2019-05-29: 30 mg via INTRAVENOUS
  Filled 2019-05-29: qty 1

## 2019-05-29 MED ORDER — HYDROCODONE-ACETAMINOPHEN 5-325 MG PO TABS: 1.0000 | ORAL_TABLET | ORAL | 0 refills | Status: AC | PRN

## 2019-05-29 NOTE — ED Notes (Signed)
Ortho static VS Lying    HR   72     B/P 132/70 Sitting   HR   79     B/P 137/79 Stand    HR  82     B/P  138/83 Denies dizziness

## 2019-05-29 NOTE — ED Triage Notes (Signed)
Pt describes chest pain beginning last Wednesday, seemed like indigestion, burping a lot, started to improve but worsened again Friday.  Denies n/v Pt denies gastric issues in past.  Reports hx COPD.  Had pna in fall, treated at home, with full recovery.  Does report dizziness with change in position, feels light headed when standing up.

## 2019-05-29 NOTE — ED Provider Notes (Signed)
[facility] EMERGENCY DEPARTMENT Provider Note   CSN: [id number] Arrival date & time: 05/29/19  1008    History   Chief Complaint Chief Complaint  Patient presents with  . Chest Pain    HPI Ricky Arellano is a 63 y.o. male.     Pt presents to the ED today with CP.  He said it started on July 1, went away on the 2nd, and came back on the 3rd.  It is intermittent.  When it comes, it is sharp in nature.  He felt like it was indigestion at first.  He did not take any otc meds for it.  The pt denies any pain now.  He's had a cough, but he has a chronic "smoker's cough."  He does not feel like it's worse than usual.  Pain with deep breaths and with touching his chest.  No known covid exposures.     History reviewed. No pertinent past medical history.  There are no active problems to display for this patient.   Past Surgical History:  Procedure Laterality Date  . TONSILLECTOMY          Home Medications    Prior to Admission medications   Medication Sig Start Date End Date Taking? Authorizing Provider  acetaminophen (TYLENOL) 325 MG tablet Take 650 mg by mouth every 6 (six) hours as needed.    [provider]  azithromycin (ZITHROMAX) 250 MG tablet Take 1 tablet (250 mg total) by mouth daily. Take first 2 tablets together, then 1 every day until finished. 08/06/18   Robyn Haber, MD  chlorpheniramine-HYDROcodone (TUSSIONEX PENNKINETIC ER) 10-8 MG/5ML SUER Take 5 mLs by mouth at bedtime as needed for cough. 08/06/18   Robyn Haber, MD  HYDROcodone-acetaminophen (NORCO/VICODIN) 5-325 MG tablet Take 1 tablet by mouth every 4 (four) hours as needed. 05/29/19   Isla Pence, MD  ibuprofen (ADVIL,MOTRIN) 200 MG tablet Take 200 mg by mouth every 6 (six) hours as needed.    [provider]    Family History History reviewed. No pertinent family history.  Social History Social History   Tobacco Use  . Smoking status: Current Some Day Smoker  .  Smokeless tobacco: Never Used  Substance Use Topics  . Alcohol use: No  . Drug use: No     Allergies   Patient has no known allergies.   Review of Systems Review of Systems  Cardiovascular: Positive for chest pain.  All other systems reviewed and are negative.    Physical Exam Updated Vital Signs BP 117/87   Pulse 71   Temp 98.2 F (36.8 C) (Oral)   Resp 16   Ht 5\' 9"  (1.753 m)   Wt 69.4 kg   SpO2 99%   BMI 22.59 kg/m   Physical Exam Vitals signs and nursing note reviewed.  Constitutional:      Appearance: He is well-developed.  HENT:     Head: Normocephalic and atraumatic.  Eyes:     Extraocular Movements: Extraocular movements intact.     Pupils: Pupils are equal, round, and reactive to light.  Neck:     Musculoskeletal: Normal range of motion and neck supple.  Cardiovascular:     Rate and Rhythm: Normal rate and regular rhythm.     Heart sounds: Normal heart sounds.  Pulmonary:     Effort: Pulmonary effort is normal.     Breath sounds: Normal breath sounds.  Chest:    Abdominal:     General: Bowel sounds are  normal.     Palpations: Abdomen is soft.  Musculoskeletal: Normal range of motion.  Skin:    General: Skin is warm and dry.     Capillary Refill: Capillary refill takes less than 2 seconds.  Neurological:     General: No focal deficit present.     Mental Status: He is alert and oriented to person, place, and time.  Psychiatric:        Mood and Affect: Mood normal.        Behavior: Behavior normal.      ED Treatments / Results  Labs (all labs ordered are listed, but only abnormal results are displayed) Labs Reviewed  BASIC METABOLIC PANEL - Abnormal; Notable for the following components:      Result Value   Glucose, Bld 101 (*)    All other components within normal limits  CBC - Abnormal; Notable for the following components:   RBC 6.15 (*)    MCH 25.7 (*)    RDW 18.1 (*)    All other components within normal limits  TROPONIN I  (HIGH SENSITIVITY)  TROPONIN I (HIGH SENSITIVITY)    EKG EKG Interpretation  Date/Time:  Wednesday May 29 2019 10:15:00 EDT Ventricular Rate:  85 PR Interval:    QRS Duration: 105 QT Interval:  385 QTC Calculation: 458 R Axis:   103 Text Interpretation:  Sinus rhythm Probable left atrial enlargement Left posterior fascicular block Nonspecific T abnormalities, lateral leads No old tracing to compare Confirmed by Isla Pence [phone]) on 05/29/2019 10:20:23 AM   Radiology No results found.  Procedures Procedures (including critical care time)  Medications Ordered in ED Medications  ketorolac (TORADOL) 30 MG/ML injection 30 mg (30 mg Intravenous Given 05/29/19 1048)     Initial Impression / Assessment and Plan / ED Course  I have reviewed the triage vital signs and the nursing notes.  Pertinent labs & imaging results that were available during my care of the patient were reviewed by me and considered in my medical decision making (see chart for details).     Pt is feeling much better.  Pain with palpation.  Seems like pain is msk.  Pt is instructed to return if worse.  F/u with pcp.  Final Clinical Impressions(s) / ED Diagnoses   Final diagnoses:  Atypical chest pain    ED Discharge Orders         Ordered    HYDROcodone-acetaminophen (NORCO/VICODIN) 5-325 MG tablet  Every 4 hours PRN     05/29/19 1325           Isla Pence, MD 05/29/19 1326

## 2019-06-16 ENCOUNTER — Other Ambulatory Visit: Payer: Self-pay

## 2019-06-16 ENCOUNTER — Encounter (HOSPITAL_BASED_OUTPATIENT_CLINIC_OR_DEPARTMENT_OTHER): Payer: Self-pay | Admitting: Emergency Medicine

## 2019-06-16 ENCOUNTER — Emergency Department (HOSPITAL_BASED_OUTPATIENT_CLINIC_OR_DEPARTMENT_OTHER)
Admission: EM | Admit: 2019-06-16 | Discharge: 2019-06-16 | Disposition: A | Discharge status: Home / Self Care | Payer: 59 | Attending: Emergency Medicine | Admitting: Emergency Medicine

## 2019-06-16 DIAGNOSIS — F17200 Nicotine dependence, unspecified, uncomplicated: Secondary | ICD-10-CM | POA: Diagnosis not present

## 2019-06-16 DIAGNOSIS — J449 Chronic obstructive pulmonary disease, unspecified: Secondary | ICD-10-CM | POA: Insufficient documentation

## 2019-06-16 DIAGNOSIS — R030 Elevated blood-pressure reading, without diagnosis of hypertension: Secondary | ICD-10-CM | POA: Insufficient documentation

## 2019-06-16 DIAGNOSIS — Z79899 Other long term (current) drug therapy: Secondary | ICD-10-CM | POA: Diagnosis not present

## 2019-06-16 DIAGNOSIS — R339 Retention of urine, unspecified: Secondary | ICD-10-CM | POA: Insufficient documentation

## 2019-06-16 HISTORY — DX: Chronic obstructive pulmonary disease, unspecified: J44.9

## 2019-06-16 LAB — BASIC METABOLIC PANEL
Anion gap: 10 (ref 5–15)
BUN: 11 mg/dL (ref 8–23)
CO2: 24 mmol/L (ref 22–32)
Calcium: 9 mg/dL (ref 8.9–10.3)
Chloride: 104 mmol/L (ref 98–111)
Creatinine, Ser: 0.91 mg/dL (ref 0.61–1.24)
GFR calc Af Amer: >60 mL/min (ref >60)
GFR calc non Af Amer: >60 mL/min (ref >60)
Glucose, Bld: 93 mg/dL (ref 70–99)
Potassium: 3.9 mmol/L (ref 3.5–5.1)
Sodium: 138 mmol/L (ref 135–145)

## 2019-06-16 LAB — URINALYSIS, ROUTINE W REFLEX MICROSCOPIC
Bilirubin Urine: NEGATIVE
Glucose, UA: NEGATIVE mg/dL
Hgb urine dipstick: NEGATIVE
Ketones, ur: NEGATIVE mg/dL
Leukocytes,Ua: NEGATIVE
Nitrite: NEGATIVE
Protein, ur: NEGATIVE mg/dL
Specific Gravity, Urine: 1.015 (ref 1.005–1.030)
pH: 6.5 (ref 5.0–8.0)

## 2019-06-16 NOTE — ED Provider Notes (Signed)
[facility] EMERGENCY DEPARTMENT Provider Note   CSN: [id number] Arrival date & time: 06/16/19  1459    History   Chief Complaint Chief Complaint  Patient presents with  . Urinary Retention    HPI Ricky Arellano is a 63 y.o. male with past medical history significant for COPD who presents for evaluation of urinary retention.  Patient states he has been unable to void since 4 AM this morning.  Patient states this happened previously in February.  Had Foley catheter placed and follow-up with urology at that time.  Patient states he is followed by urology and told is due to enlarged prostate.  He does not take any medicines for this and has not had any issues previously.  He has intermittently been taking Benadryl for seasonal allergies.  No prior history of UTIs.  No fever, chills, nausea, vomiting, abdominal pain, dysuria, diarrhea, constipation.  Denies pain with bowel movements.  Onset of symptoms gradual.  Nothing makes symptoms worse or better.  Has not take anything for symptoms.  Denies additional aggravating relieving factors  History obtained from patient and past medical records.  No interpreter was used.     HPI  Past Medical History:  Diagnosis Date  . COPD (chronic obstructive pulmonary disease) (HCC)     There are no active problems to display for this patient.   Past Surgical History:  Procedure Laterality Date  . TONSILLECTOMY          Home Medications    Prior to Admission medications   Medication Sig Start Date End Date Taking? Authorizing Provider  acetaminophen (TYLENOL) 325 MG tablet Take 650 mg by mouth every 6 (six) hours as needed.    [provider]  azithromycin (ZITHROMAX) 250 MG tablet Take 1 tablet (250 mg total) by mouth daily. Take first 2 tablets together, then 1 every day until finished. 08/06/18   Robyn Haber, MD  chlorpheniramine-HYDROcodone (TUSSIONEX PENNKINETIC ER) 10-8 MG/5ML SUER Take 5 mLs by mouth at bedtime  as needed for cough. 08/06/18   Robyn Haber, MD  HYDROcodone-acetaminophen (NORCO/VICODIN) 5-325 MG tablet Take 1 tablet by mouth every 4 (four) hours as needed. 05/29/19   Isla Pence, MD  ibuprofen (ADVIL,MOTRIN) 200 MG tablet Take 200 mg by mouth every 6 (six) hours as needed.    [provider]    Family History No family history on file.  Social History Social History   Tobacco Use  . Smoking status: Current Some Day Smoker  . Smokeless tobacco: Never Used  Substance Use Topics  . Alcohol use: No  . Drug use: No     Allergies   Patient has no known allergies.   Review of Systems Review of Systems  Constitutional: Negative.   HENT: Negative.   Respiratory: Negative.   Cardiovascular: Negative.   Gastrointestinal: Negative.   Genitourinary: Positive for decreased urine volume and difficulty urinating. Negative for discharge, dysuria, flank pain, frequency, hematuria, penile pain, penile swelling, scrotal swelling, testicular pain and urgency.  Musculoskeletal: Negative.   Skin: Negative.   Neurological: Negative.   All other systems reviewed and are negative.    Physical Exam Updated Vital Signs BP (!) 169/101 (BP Location: Right Arm)   Pulse 97   Temp 98.5 F (36.9 C) (Oral)   Resp 18   Ht 5\' 8"  (1.727 m)   Wt 69.9 kg   SpO2 98%   BMI 23.42 kg/m   Physical Exam Vitals signs and nursing note reviewed. Exam conducted  with a chaperone present.  Constitutional:      General: He is not in acute distress.    Appearance: He is well-developed. He is not ill-appearing, toxic-appearing or diaphoretic.  HENT:     Head: Normocephalic and atraumatic.     Nose: Nose normal.     Mouth/Throat:     Mouth: Mucous membranes are moist.     Pharynx: Oropharynx is clear.  Eyes:     Pupils: Pupils are equal, round, and reactive to light.  Neck:     Musculoskeletal: Normal range of motion and neck supple.  Cardiovascular:     Rate and Rhythm: Normal rate  and regular rhythm.     Pulses: Normal pulses.     Heart sounds: Normal heart sounds. No murmur. No friction rub.  Pulmonary:     Effort: Pulmonary effort is normal. No respiratory distress.     Breath sounds: Normal breath sounds. No stridor. No wheezing, rhonchi or rales.  Abdominal:     General: Bowel sounds are normal. There is no distension.     Palpations: Abdomen is soft.     Hernia: There is no hernia in the left inguinal area or right inguinal area.     Comments: Distended lower abdomen and suprapubic region. Nontender to palpation.  No Rebound or guarding.  No overlying skin changes.  Genitourinary:    Penis: Normal.      Scrotum/Testes: Normal. Cremasteric reflex is present.     Epididymis:     Right: Normal.     Left: Normal.  Musculoskeletal: Normal range of motion.     Comments: Moves all 4 extremities without difficulty.  No Lower extremity edema, erythema, ecchymosis or warmth  Lymphadenopathy:     Lower Body: No right inguinal adenopathy. No left inguinal adenopathy.  Skin:    General: Skin is warm and dry.     Comments: No rashes or lesions. Brisk cap refill.  Neurological:     General: No focal deficit present.     Mental Status: He is alert.     Cranial Nerves: Cranial nerves are intact.     Sensory: Sensation is intact.     Motor: Motor function is intact.     Coordination: Coordination is intact.     Gait: Gait is intact.     Comments: No facial droop. CN 2-12 grossly intact.     ED Treatments / Results  Labs (all labs ordered are listed, but only abnormal results are displayed) Labs Reviewed  URINE CULTURE  URINALYSIS, ROUTINE W REFLEX MICROSCOPIC  BASIC METABOLIC PANEL    EKG None  Radiology No results found.  Procedures Procedures (including critical care time)  Medications Ordered in ED Medications - No data to display   Initial Impression / Assessment and Plan / ED Course  I have reviewed the triage vital signs and the nursing  notes.  Pertinent labs & imaging results that were available during my care of the patient were reviewed by me and considered in my medical decision making (see chart for details).  63 year old male appears otherwise well presents for evaluation of urinary retention.  History of similar requiring need for Foley catheter.  Followed by [facility] was diagnosed with enlarged prostate likely secondary to Benadryl use, per patient.  Patient with distended mildly tender to palpation lower abdomen on initial evaluation.  No focal tenderness.  No overlying skin changes to abdominal wall.  He denies dysuria or hematuria.  Foley Catheter placed by nursing after  700 cc bladder scan.  On reevaluation abdomen soft, nontender without rebound or guarding.  Nondistended abdomen.  Patient rates his current pain is 0/10.  Urinalysis negative for infection.  Will culture urine. Metabolic panel without elevated creatinine or BUN.  Will leave Foley catheter in place and have patient follow-up with urology for voiding trial. Likely retention due to Benadryl use and some degree of chronic BPH.  Had mildly elevated blood pressure department however denies headache, vision changes, lateral weakness, chest pain, shortness of breath, nausea or vomiting.  I will suspicion for hypertensive urgency or emergency.  Patient denies history of elevated blood pressure however chart review shows he has had elevated blood pressure at his last few visits.  Discussed with patient to follow-up with PCP for reevaluation.  The patient has been appropriately medically screened and/or stabilized in the ED. I have low suspicion for any other emergent medical condition which would require further screening, evaluation or treatment in the ED or require inpatient management.  Patient is hemodynamically stable and in no acute distress.  Patient able to ambulate in department prior to ED.  Evaluation does not show acute pathology that would require  ongoing or additional emergent interventions while in the emergency department or further inpatient treatment.  I have discussed the diagnosis with the patient and answered all questions.  Pain is been managed while in the emergency department and patient has no further complaints prior to discharge.  Patient is comfortable with plan discussed in room and is stable for discharge at this time.  I have discussed strict return precautions for returning to the emergency department.  Patient was encouraged to follow-up with PCP/specialist refer to at discharge.     Final Clinical Impressions(s) / ED Diagnoses   Final diagnoses:  Urinary retention  Elevated blood pressure reading    ED Discharge Orders    None       Masami Plata A, PA-C 06/16/19 1644    Julianne Rice, MD 06/16/19 2312

## 2019-06-16 NOTE — Discharge Instructions (Addendum)
Follow-up with urology.  Follow-up with PCP for reevaluation of your blood pressure.  Return to the ED for any new or worsening symtpoms

## 2019-06-16 NOTE — ED Notes (Signed)
Patient came back to check in window to report foley was leaking from around the meatus.  Provider made aware.  14 fr removed and 16 fr inserted.  Tolerated well.  Leaving at this time.

## 2019-06-16 NOTE — ED Notes (Signed)
Attempted to switch foley to leg bag.  Patient requests leaving the large bag instead.  Stat lock placed and patient educated on emptying foley.  Discharge instructions reviewed.

## 2019-06-16 NOTE — ED Triage Notes (Signed)
Reports urinary retention since 4 am.  Hx of the same.

## 2019-06-17 LAB — URINE CULTURE: Culture: NO GROWTH

## 2019-06-24 DIAGNOSIS — R338 Other retention of urine: Secondary | ICD-10-CM | POA: Diagnosis not present

## 2019-06-24 DIAGNOSIS — N401 Enlarged prostate with lower urinary tract symptoms: Secondary | ICD-10-CM | POA: Diagnosis not present

## 2019-06-25 DIAGNOSIS — R338 Other retention of urine: Secondary | ICD-10-CM | POA: Diagnosis not present

## 2019-06-25 DIAGNOSIS — R8271 Bacteriuria: Secondary | ICD-10-CM | POA: Diagnosis not present

## 2019-07-02 DIAGNOSIS — R338 Other retention of urine: Secondary | ICD-10-CM | POA: Diagnosis not present

## 2019-07-02 DIAGNOSIS — N401 Enlarged prostate with lower urinary tract symptoms: Secondary | ICD-10-CM | POA: Diagnosis not present

## 2019-07-15 MED FILL — TAMSULOSIN HCL 0.4 MG CAP: 0.4 | 30 days supply | Qty: 60 | Fill #0

## 2019-07-25 DIAGNOSIS — Z13228 Encounter for screening for other metabolic disorders: Secondary | ICD-10-CM | POA: Diagnosis not present

## 2019-07-25 DIAGNOSIS — Z0000 Encounter for general adult medical examination without abnormal findings: Secondary | ICD-10-CM | POA: Diagnosis not present

## 2019-07-25 DIAGNOSIS — Z130 Encounter for screening for diseases of the blood and blood-forming organs and certain disorders involving the immune mechanism: Secondary | ICD-10-CM | POA: Diagnosis not present

## 2019-07-25 DIAGNOSIS — F17210 Nicotine dependence, cigarettes, uncomplicated: Secondary | ICD-10-CM | POA: Diagnosis not present

## 2019-07-25 DIAGNOSIS — Z13220 Encounter for screening for lipoid disorders: Secondary | ICD-10-CM | POA: Diagnosis not present

## 2019-07-25 DIAGNOSIS — J438 Other emphysema: Secondary | ICD-10-CM | POA: Diagnosis not present

## 2019-07-25 DIAGNOSIS — Z23 Encounter for immunization: Secondary | ICD-10-CM | POA: Diagnosis not present

## 2019-07-25 DIAGNOSIS — Z1329 Encounter for screening for other suspected endocrine disorder: Secondary | ICD-10-CM | POA: Diagnosis not present

## 2019-08-05 DIAGNOSIS — N401 Enlarged prostate with lower urinary tract symptoms: Secondary | ICD-10-CM | POA: Diagnosis not present

## 2019-08-05 DIAGNOSIS — R338 Other retention of urine: Secondary | ICD-10-CM | POA: Diagnosis not present

## 2019-08-19 MED FILL — TAMSULOSIN HCL 0.4 MG CAP: 0.4 | 30 days supply | Qty: 60 | Fill #1

## 2019-08-23 DIAGNOSIS — J449 Chronic obstructive pulmonary disease, unspecified: Secondary | ICD-10-CM | POA: Diagnosis not present

## 2019-08-23 DIAGNOSIS — R0602 Shortness of breath: Secondary | ICD-10-CM | POA: Diagnosis not present

## 2019-08-23 DIAGNOSIS — F17210 Nicotine dependence, cigarettes, uncomplicated: Secondary | ICD-10-CM | POA: Diagnosis not present

## 2019-08-23 DIAGNOSIS — J438 Other emphysema: Secondary | ICD-10-CM | POA: Diagnosis not present

## 2019-08-23 MED FILL — TRELEGY ELLIPTA 100-62.5-25: 100-62.5-25 | 30 days supply | Qty: 60 | Fill #0

## 2019-09-27 MED FILL — TAMSULOSIN HCL 0.4 MG CAP: 0.4 | 30 days supply | Qty: 60 | Fill #2

## 2019-10-03 DIAGNOSIS — E279 Disorder of adrenal gland, unspecified: Secondary | ICD-10-CM | POA: Diagnosis not present

## 2019-10-03 DIAGNOSIS — I700 Atherosclerosis of aorta: Secondary | ICD-10-CM | POA: Diagnosis not present

## 2019-10-03 DIAGNOSIS — R918 Other nonspecific abnormal finding of lung field: Secondary | ICD-10-CM | POA: Diagnosis not present

## 2019-10-03 DIAGNOSIS — J349 Unspecified disorder of nose and nasal sinuses: Secondary | ICD-10-CM | POA: Diagnosis not present

## 2019-10-03 DIAGNOSIS — Z87891 Personal history of nicotine dependence: Secondary | ICD-10-CM | POA: Diagnosis not present

## 2019-10-07 DIAGNOSIS — K648 Other hemorrhoids: Secondary | ICD-10-CM | POA: Diagnosis not present

## 2019-10-07 DIAGNOSIS — K5730 Diverticulosis of large intestine without perforation or abscess without bleeding: Secondary | ICD-10-CM | POA: Diagnosis not present

## 2019-10-07 DIAGNOSIS — K635 Polyp of colon: Secondary | ICD-10-CM | POA: Diagnosis not present

## 2019-10-07 DIAGNOSIS — Z1211 Encounter for screening for malignant neoplasm of colon: Secondary | ICD-10-CM | POA: Diagnosis not present

## 2019-10-07 DIAGNOSIS — K6389 Other specified diseases of intestine: Secondary | ICD-10-CM | POA: Diagnosis not present

## 2019-10-07 DIAGNOSIS — K629 Disease of anus and rectum, unspecified: Secondary | ICD-10-CM | POA: Diagnosis not present

## 2019-10-08 DIAGNOSIS — F17210 Nicotine dependence, cigarettes, uncomplicated: Secondary | ICD-10-CM | POA: Diagnosis not present

## 2019-10-08 DIAGNOSIS — R918 Other nonspecific abnormal finding of lung field: Secondary | ICD-10-CM | POA: Diagnosis not present

## 2019-10-08 DIAGNOSIS — J449 Chronic obstructive pulmonary disease, unspecified: Secondary | ICD-10-CM | POA: Diagnosis not present

## 2019-10-08 MED FILL — TRELEGY ELLIPTA 100-62.5-25: 100-62.5-25 | 30 days supply | Qty: 60 | Fill #0

## 2019-10-10 DIAGNOSIS — R911 Solitary pulmonary nodule: Secondary | ICD-10-CM | POA: Diagnosis not present

## 2019-10-10 DIAGNOSIS — F17210 Nicotine dependence, cigarettes, uncomplicated: Secondary | ICD-10-CM | POA: Diagnosis not present

## 2019-10-10 DIAGNOSIS — Z01818 Encounter for other preprocedural examination: Secondary | ICD-10-CM | POA: Diagnosis not present

## 2019-10-10 DIAGNOSIS — E278 Other specified disorders of adrenal gland: Secondary | ICD-10-CM | POA: Diagnosis not present

## 2019-10-10 DIAGNOSIS — J449 Chronic obstructive pulmonary disease, unspecified: Secondary | ICD-10-CM | POA: Diagnosis not present

## 2019-10-10 DIAGNOSIS — Z8249 Family history of ischemic heart disease and other diseases of the circulatory system: Secondary | ICD-10-CM | POA: Diagnosis not present

## 2019-10-10 DIAGNOSIS — R918 Other nonspecific abnormal finding of lung field: Secondary | ICD-10-CM | POA: Diagnosis not present

## 2019-10-16 DIAGNOSIS — R918 Other nonspecific abnormal finding of lung field: Secondary | ICD-10-CM | POA: Diagnosis not present

## 2019-10-16 DIAGNOSIS — R928 Other abnormal and inconclusive findings on diagnostic imaging of breast: Secondary | ICD-10-CM | POA: Diagnosis not present

## 2019-10-16 DIAGNOSIS — J449 Chronic obstructive pulmonary disease, unspecified: Secondary | ICD-10-CM | POA: Diagnosis not present

## 2019-10-16 DIAGNOSIS — Z01812 Encounter for preprocedural laboratory examination: Secondary | ICD-10-CM | POA: Diagnosis not present

## 2019-10-16 DIAGNOSIS — E278 Other specified disorders of adrenal gland: Secondary | ICD-10-CM | POA: Diagnosis not present

## 2019-10-16 DIAGNOSIS — F17210 Nicotine dependence, cigarettes, uncomplicated: Secondary | ICD-10-CM | POA: Diagnosis not present

## 2019-10-16 DIAGNOSIS — J432 Centrilobular emphysema: Secondary | ICD-10-CM | POA: Diagnosis not present

## 2019-10-16 DIAGNOSIS — R911 Solitary pulmonary nodule: Secondary | ICD-10-CM | POA: Diagnosis not present

## 2019-10-16 DIAGNOSIS — Z20828 Contact with and (suspected) exposure to other viral communicable diseases: Secondary | ICD-10-CM | POA: Diagnosis not present

## 2019-10-18 DIAGNOSIS — R918 Other nonspecific abnormal finding of lung field: Secondary | ICD-10-CM | POA: Diagnosis not present

## 2019-10-22 ENCOUNTER — Other Ambulatory Visit: Payer: Self-pay | Admitting: *Deleted

## 2019-10-22 NOTE — Patient Outreach (Signed)
[facility]) Care Management  10/22/2019  COUNCIL MUNGUIA Dec 12, 1955 [id number]  Preoperative Screening Call  Referral received: 09/24/19 Surgery/Procedure date: 10/23/19 Initial outreach: 10/22/19 Insurance: [place]   Initial unsuccessful telephone call to patient's preferred number in order to complete  preoperative screening; no answer, left HIPAA compliant voicemail message requesting return call.   Objective: Per the electronic medical record, Mr. Vieau  is scheduled for  Wedge resection robotic,possible lobectomy and lymph node resection 12/2 /20 at [facility]. He completed his  pre-operative admission testing visit on  11/19 and preop covid 11/25 . Comorbidities include: COPD stage 3, urinary retention, history of smoking.   Plan:  If patient does not return call today, this RNCM will call patient for transition of care outreach within 72 hours of hospital discharge notification.   Joylene Draft, RN, [place] Management Coordinator  [phone]- Mobile [phone]- Toll Free Main Office

## 2019-10-28 DIAGNOSIS — E278 Other specified disorders of adrenal gland: Secondary | ICD-10-CM | POA: Diagnosis not present

## 2019-10-31 MED FILL — TAMSULOSIN HCL 0.4 MG CAP: 0.4 | 30 days supply | Qty: 60 | Fill #3

## 2019-11-04 ENCOUNTER — Other Ambulatory Visit: Payer: Self-pay | Admitting: *Deleted

## 2019-11-04 NOTE — Patient Outreach (Addendum)
[facility]) Care Management  11/04/2019  Ricky Arellano [birth year] [id number]   Preoperative screening follow up.   Referral received: 09/24/19 Surgery/Procedure date: 10/23/19 Initial outreach: 10/22/19 Insurance: [place] [employer]   IObjective: Per the electronic medical record, Ricky Arellano  was  scheduled for  Wedge resection robotic,possible lobectomy and lymph node resection 12/2 /20 at [facility]. He completed his  pre-operative admission testing visit on  11/19 and preop covid 11/25 . Comorbidities include: COPD stage 3, urinary retention, history of smoking.  Noted in care everywhere  surgery not completed on 10/23/19, no update on inpatient planned admission or surgery.     Plan Will plan follow up in the next 3 weeks  and if no surgical plan will close case and await new referral.    Joylene Draft, RN, [place] Management Coordinator  [phone]- Mobile [phone]- [place]

## 2019-11-08 DIAGNOSIS — E278 Other specified disorders of adrenal gland: Secondary | ICD-10-CM | POA: Diagnosis not present

## 2019-11-21 DIAGNOSIS — F17210 Nicotine dependence, cigarettes, uncomplicated: Secondary | ICD-10-CM | POA: Diagnosis not present

## 2019-11-21 DIAGNOSIS — J449 Chronic obstructive pulmonary disease, unspecified: Secondary | ICD-10-CM | POA: Diagnosis not present

## 2019-11-21 DIAGNOSIS — R918 Other nonspecific abnormal finding of lung field: Secondary | ICD-10-CM | POA: Diagnosis not present

## 2019-11-25 ENCOUNTER — Other Ambulatory Visit: Payer: Self-pay | Admitting: *Deleted

## 2019-11-25 NOTE — Patient Outreach (Signed)
[facility]) Care Management  11/25/2019  NIMROD WENDT 1956-01-24 [id number]   Preoperative screening follow up.   Referral received:09/24/19 Surgery/Procedure date:10/23/19 Initial outreach:10/22/19 [facility]  IObjective: Per the electronic medical record,Mr. Hillwas  scheduled for Wedge resection robotic,possible lobectomy and lymph node resection 10/23/19 at [facility]. He completed his pre-operative admission testing visit on 11/19 and preop covid 11/25. Comorbidities include:COPD stage 3, urinary retention, history of smoking. Noted in care everywhere  surgery not completed on 10/23/19, no update on inpatient planned admission or surgery.    Plan Will close case to [facility] care management and await new referral as directed.    Joylene Draft, RN, [place] Management Coordinator  [phone]- Mobile [phone]- Toll Free Main Office

## 2019-11-27 DIAGNOSIS — C7972 Secondary malignant neoplasm of left adrenal gland: Secondary | ICD-10-CM | POA: Diagnosis not present

## 2019-11-27 DIAGNOSIS — E279 Disorder of adrenal gland, unspecified: Secondary | ICD-10-CM | POA: Diagnosis not present

## 2019-11-27 DIAGNOSIS — C799 Secondary malignant neoplasm of unspecified site: Secondary | ICD-10-CM | POA: Diagnosis not present

## 2019-11-27 DIAGNOSIS — F17210 Nicotine dependence, cigarettes, uncomplicated: Secondary | ICD-10-CM | POA: Diagnosis not present

## 2019-11-27 DIAGNOSIS — C3411 Malignant neoplasm of upper lobe, right bronchus or lung: Secondary | ICD-10-CM | POA: Diagnosis not present

## 2019-11-29 MED FILL — TAMSULOSIN HCL 0.4 MG CAP: 0.4 | 30 days supply | Qty: 60 | Fill #4

## 2019-11-29 MED FILL — TRELEGY ELLIPTA 100-62.5-25: 100-62.5-25 | 30 days supply | Qty: 60 | Fill #1

## 2019-12-03 DIAGNOSIS — R918 Other nonspecific abnormal finding of lung field: Secondary | ICD-10-CM | POA: Diagnosis not present

## 2019-12-03 DIAGNOSIS — C7801 Secondary malignant neoplasm of right lung: Secondary | ICD-10-CM | POA: Diagnosis not present

## 2019-12-03 DIAGNOSIS — J449 Chronic obstructive pulmonary disease, unspecified: Secondary | ICD-10-CM | POA: Diagnosis not present

## 2019-12-11 DIAGNOSIS — E278 Other specified disorders of adrenal gland: Secondary | ICD-10-CM | POA: Diagnosis not present

## 2019-12-11 DIAGNOSIS — C7801 Secondary malignant neoplasm of right lung: Secondary | ICD-10-CM | POA: Diagnosis not present

## 2019-12-12 MED FILL — ONDANSETRON ODT 8 MG TABLET: 8 | 5 days supply | Qty: 20 | Fill #0

## 2019-12-12 MED FILL — DEXAMETHASONE 4 MG TABLET: 4 | 12 days supply | Qty: 24 | Fill #0

## 2019-12-12 MED FILL — FOLIC ACID 1 MG TABS: 1 | 30 days supply | Qty: 30 | Fill #0

## 2019-12-16 DIAGNOSIS — C3411 Malignant neoplasm of upper lobe, right bronchus or lung: Secondary | ICD-10-CM | POA: Diagnosis not present

## 2019-12-16 DIAGNOSIS — C7801 Secondary malignant neoplasm of right lung: Secondary | ICD-10-CM | POA: Diagnosis not present

## 2019-12-18 DIAGNOSIS — Z452 Encounter for adjustment and management of vascular access device: Secondary | ICD-10-CM | POA: Diagnosis not present

## 2019-12-18 DIAGNOSIS — C771 Secondary and unspecified malignant neoplasm of intrathoracic lymph nodes: Secondary | ICD-10-CM | POA: Diagnosis not present

## 2019-12-18 DIAGNOSIS — C7972 Secondary malignant neoplasm of left adrenal gland: Secondary | ICD-10-CM | POA: Diagnosis not present

## 2019-12-18 DIAGNOSIS — Z87891 Personal history of nicotine dependence: Secondary | ICD-10-CM | POA: Diagnosis not present

## 2019-12-18 DIAGNOSIS — C3411 Malignant neoplasm of upper lobe, right bronchus or lung: Secondary | ICD-10-CM | POA: Diagnosis not present

## 2019-12-18 DIAGNOSIS — Z5111 Encounter for antineoplastic chemotherapy: Secondary | ICD-10-CM | POA: Diagnosis not present

## 2019-12-18 DIAGNOSIS — Z79899 Other long term (current) drug therapy: Secondary | ICD-10-CM | POA: Diagnosis not present

## 2019-12-18 DIAGNOSIS — C3490 Malignant neoplasm of unspecified part of unspecified bronchus or lung: Secondary | ICD-10-CM | POA: Diagnosis not present

## 2019-12-23 DIAGNOSIS — C3491 Malignant neoplasm of unspecified part of right bronchus or lung: Secondary | ICD-10-CM | POA: Diagnosis not present

## 2019-12-23 DIAGNOSIS — R911 Solitary pulmonary nodule: Secondary | ICD-10-CM | POA: Diagnosis not present

## 2019-12-23 DIAGNOSIS — C7801 Secondary malignant neoplasm of right lung: Secondary | ICD-10-CM | POA: Diagnosis not present

## 2019-12-23 DIAGNOSIS — C3490 Malignant neoplasm of unspecified part of unspecified bronchus or lung: Secondary | ICD-10-CM | POA: Diagnosis not present

## 2019-12-24 DIAGNOSIS — Z79899 Other long term (current) drug therapy: Secondary | ICD-10-CM | POA: Diagnosis not present

## 2019-12-24 DIAGNOSIS — C772 Secondary and unspecified malignant neoplasm of intra-abdominal lymph nodes: Secondary | ICD-10-CM | POA: Diagnosis not present

## 2019-12-24 DIAGNOSIS — C3411 Malignant neoplasm of upper lobe, right bronchus or lung: Secondary | ICD-10-CM | POA: Diagnosis not present

## 2019-12-24 DIAGNOSIS — C7972 Secondary malignant neoplasm of left adrenal gland: Secondary | ICD-10-CM | POA: Diagnosis not present

## 2019-12-24 DIAGNOSIS — Z5111 Encounter for antineoplastic chemotherapy: Secondary | ICD-10-CM | POA: Diagnosis not present

## 2019-12-24 DIAGNOSIS — Z5112 Encounter for antineoplastic immunotherapy: Secondary | ICD-10-CM | POA: Diagnosis not present

## 2019-12-24 DIAGNOSIS — C7801 Secondary malignant neoplasm of right lung: Secondary | ICD-10-CM | POA: Diagnosis not present

## 2020-01-06 MED FILL — TAMSULOSIN HCL 0.4 MG CAP: 0.4 | 30 days supply | Qty: 60 | Fill #5

## 2020-01-13 MED FILL — FOLIC ACID 1 MG TABS: 1 | 30 days supply | Qty: 30 | Fill #1

## 2020-01-14 DIAGNOSIS — C7801 Secondary malignant neoplasm of right lung: Secondary | ICD-10-CM | POA: Diagnosis not present

## 2020-01-14 DIAGNOSIS — J449 Chronic obstructive pulmonary disease, unspecified: Secondary | ICD-10-CM | POA: Diagnosis not present

## 2020-01-15 DIAGNOSIS — C772 Secondary and unspecified malignant neoplasm of intra-abdominal lymph nodes: Secondary | ICD-10-CM | POA: Diagnosis not present

## 2020-01-15 DIAGNOSIS — Z09 Encounter for follow-up examination after completed treatment for conditions other than malignant neoplasm: Secondary | ICD-10-CM | POA: Diagnosis not present

## 2020-01-15 DIAGNOSIS — C7972 Secondary malignant neoplasm of left adrenal gland: Secondary | ICD-10-CM | POA: Diagnosis not present

## 2020-01-15 DIAGNOSIS — Z5111 Encounter for antineoplastic chemotherapy: Secondary | ICD-10-CM | POA: Diagnosis not present

## 2020-01-15 DIAGNOSIS — C7801 Secondary malignant neoplasm of right lung: Secondary | ICD-10-CM | POA: Diagnosis not present

## 2020-01-15 DIAGNOSIS — C3412 Malignant neoplasm of upper lobe, left bronchus or lung: Secondary | ICD-10-CM | POA: Diagnosis not present

## 2020-01-20 DIAGNOSIS — C7801 Secondary malignant neoplasm of right lung: Secondary | ICD-10-CM | POA: Diagnosis not present

## 2020-01-20 DIAGNOSIS — C772 Secondary and unspecified malignant neoplasm of intra-abdominal lymph nodes: Secondary | ICD-10-CM | POA: Diagnosis not present

## 2020-01-20 DIAGNOSIS — C3411 Malignant neoplasm of upper lobe, right bronchus or lung: Secondary | ICD-10-CM | POA: Diagnosis not present

## 2020-01-20 DIAGNOSIS — Z5112 Encounter for antineoplastic immunotherapy: Secondary | ICD-10-CM | POA: Diagnosis not present

## 2020-02-10 DIAGNOSIS — C7972 Secondary malignant neoplasm of left adrenal gland: Secondary | ICD-10-CM | POA: Diagnosis not present

## 2020-02-10 DIAGNOSIS — Z5112 Encounter for antineoplastic immunotherapy: Secondary | ICD-10-CM | POA: Diagnosis not present

## 2020-02-10 DIAGNOSIS — Z09 Encounter for follow-up examination after completed treatment for conditions other than malignant neoplasm: Secondary | ICD-10-CM | POA: Diagnosis not present

## 2020-02-10 DIAGNOSIS — Z79899 Other long term (current) drug therapy: Secondary | ICD-10-CM | POA: Diagnosis not present

## 2020-02-10 DIAGNOSIS — Z5111 Encounter for antineoplastic chemotherapy: Secondary | ICD-10-CM | POA: Diagnosis not present

## 2020-02-10 DIAGNOSIS — C772 Secondary and unspecified malignant neoplasm of intra-abdominal lymph nodes: Secondary | ICD-10-CM | POA: Diagnosis not present

## 2020-02-10 DIAGNOSIS — C7801 Secondary malignant neoplasm of right lung: Secondary | ICD-10-CM | POA: Diagnosis not present

## 2020-02-10 DIAGNOSIS — C3411 Malignant neoplasm of upper lobe, right bronchus or lung: Secondary | ICD-10-CM | POA: Diagnosis not present

## 2020-02-10 MED FILL — FOLIC ACID 1 MG TABS: 1 | 30 days supply | Qty: 30 | Fill #2

## 2020-02-10 MED FILL — TAMSULOSIN HCL 0.4 MG CAP: 0.4 | 30 days supply | Qty: 60 | Fill #6

## 2020-02-13 ENCOUNTER — Ambulatory Visit: Payer: 59 | Attending: Internal Medicine

## 2020-02-13 DIAGNOSIS — Z23 Encounter for immunization: Secondary | ICD-10-CM

## 2020-02-13 NOTE — Progress Notes (Signed)
   Covid-19 Vaccination Clinic  Name:  YAZID POP    MRN: [id number] DOB: [birth year]  02/13/2020  Mr. Cianci was observed post Covid-19 immunization for 15 minutes without incident. He was provided with Vaccine Information Sheet and instruction to access the V-Safe system.   Mr. Bartling was instructed to call 911 with any severe reactions post vaccine: Marland Kitchen Difficulty breathing  . Swelling of face and throat  . A fast heartbeat  . A bad rash all over body  . Dizziness and weakness   Immunizations Administered    Name Date Dose VIS Date Route   Pfizer COVID-19 Vaccine 02/13/2020 12:55 PM 0.3 mL 11/01/2019 Intramuscular   Manufacturer: [place]   Lot: [id number]   [place]: [address]-6

## 2020-03-02 DIAGNOSIS — Z5112 Encounter for antineoplastic immunotherapy: Secondary | ICD-10-CM | POA: Diagnosis not present

## 2020-03-02 DIAGNOSIS — Z5111 Encounter for antineoplastic chemotherapy: Secondary | ICD-10-CM | POA: Diagnosis not present

## 2020-03-02 DIAGNOSIS — C3411 Malignant neoplasm of upper lobe, right bronchus or lung: Secondary | ICD-10-CM | POA: Diagnosis not present

## 2020-03-02 DIAGNOSIS — C7801 Secondary malignant neoplasm of right lung: Secondary | ICD-10-CM | POA: Diagnosis not present

## 2020-03-02 DIAGNOSIS — Z79899 Other long term (current) drug therapy: Secondary | ICD-10-CM | POA: Diagnosis not present

## 2020-03-02 DIAGNOSIS — C772 Secondary and unspecified malignant neoplasm of intra-abdominal lymph nodes: Secondary | ICD-10-CM | POA: Diagnosis not present

## 2020-03-02 DIAGNOSIS — C7972 Secondary malignant neoplasm of left adrenal gland: Secondary | ICD-10-CM | POA: Diagnosis not present

## 2020-03-09 ENCOUNTER — Ambulatory Visit: Payer: 59 | Attending: Internal Medicine

## 2020-03-09 DIAGNOSIS — Z23 Encounter for immunization: Secondary | ICD-10-CM

## 2020-03-09 NOTE — Progress Notes (Signed)
   Covid-19 Vaccination Clinic  Name:  YAVUZ KIRBY    MRN: [id number] DOB: Apr 29, 1956  03/09/2020  Mr. Roskos was observed post Covid-19 immunization for 15 minutes without incident. He was provided with Vaccine Information Sheet and instruction to access the V-Safe system.   Mr. Alipio was instructed to call 911 with any severe reactions post vaccine: Marland Kitchen Difficulty breathing  . Swelling of face and throat  . A fast heartbeat  . A bad rash all over body  . Dizziness and weakness   Immunizations Administered    Name Date Dose VIS Date Route   Pfizer COVID-19 Vaccine 03/09/2020 11:25 AM 0.3 mL 01/15/2019 Intramuscular   Manufacturer: [place]   Lot: [id number]   [place]: [id number]

## 2020-03-16 MED FILL — TRELEGY ELLIPTA 100-62.5-25: 100-62.5-25 | 30 days supply | Qty: 60 | Fill #2

## 2020-03-20 DIAGNOSIS — C7801 Secondary malignant neoplasm of right lung: Secondary | ICD-10-CM | POA: Diagnosis not present

## 2020-03-20 DIAGNOSIS — C7972 Secondary malignant neoplasm of left adrenal gland: Secondary | ICD-10-CM | POA: Diagnosis not present

## 2020-03-20 DIAGNOSIS — C3491 Malignant neoplasm of unspecified part of right bronchus or lung: Secondary | ICD-10-CM | POA: Diagnosis not present

## 2020-03-23 DIAGNOSIS — Z79899 Other long term (current) drug therapy: Secondary | ICD-10-CM | POA: Diagnosis not present

## 2020-03-23 DIAGNOSIS — C772 Secondary and unspecified malignant neoplasm of intra-abdominal lymph nodes: Secondary | ICD-10-CM | POA: Diagnosis not present

## 2020-03-23 DIAGNOSIS — Z5111 Encounter for antineoplastic chemotherapy: Secondary | ICD-10-CM | POA: Diagnosis not present

## 2020-03-23 DIAGNOSIS — C7972 Secondary malignant neoplasm of left adrenal gland: Secondary | ICD-10-CM | POA: Diagnosis not present

## 2020-03-23 DIAGNOSIS — C3411 Malignant neoplasm of upper lobe, right bronchus or lung: Secondary | ICD-10-CM | POA: Diagnosis not present

## 2020-03-23 DIAGNOSIS — Z5112 Encounter for antineoplastic immunotherapy: Secondary | ICD-10-CM | POA: Diagnosis not present

## 2020-03-23 DIAGNOSIS — C7801 Secondary malignant neoplasm of right lung: Secondary | ICD-10-CM | POA: Diagnosis not present

## 2020-03-23 MED FILL — TAMSULOSIN HCL 0.4 MG CAP: 0.4 | 30 days supply | Qty: 60 | Fill #7

## 2020-03-23 MED FILL — FOLIC ACID 1 MG TABS: 1 | 30 days supply | Qty: 30 | Fill #3

## 2020-04-21 DIAGNOSIS — C772 Secondary and unspecified malignant neoplasm of intra-abdominal lymph nodes: Secondary | ICD-10-CM | POA: Diagnosis not present

## 2020-04-21 DIAGNOSIS — Z79899 Other long term (current) drug therapy: Secondary | ICD-10-CM | POA: Diagnosis not present

## 2020-04-21 DIAGNOSIS — Z5112 Encounter for antineoplastic immunotherapy: Secondary | ICD-10-CM | POA: Diagnosis not present

## 2020-04-21 DIAGNOSIS — C7972 Secondary malignant neoplasm of left adrenal gland: Secondary | ICD-10-CM | POA: Diagnosis not present

## 2020-04-21 DIAGNOSIS — Z5111 Encounter for antineoplastic chemotherapy: Secondary | ICD-10-CM | POA: Diagnosis not present

## 2020-04-21 DIAGNOSIS — C7801 Secondary malignant neoplasm of right lung: Secondary | ICD-10-CM | POA: Diagnosis not present

## 2020-04-21 DIAGNOSIS — C3411 Malignant neoplasm of upper lobe, right bronchus or lung: Secondary | ICD-10-CM | POA: Diagnosis not present

## 2020-05-11 DIAGNOSIS — C7972 Secondary malignant neoplasm of left adrenal gland: Secondary | ICD-10-CM | POA: Diagnosis not present

## 2020-05-11 DIAGNOSIS — C772 Secondary and unspecified malignant neoplasm of intra-abdominal lymph nodes: Secondary | ICD-10-CM | POA: Diagnosis not present

## 2020-05-11 DIAGNOSIS — Z79899 Other long term (current) drug therapy: Secondary | ICD-10-CM | POA: Diagnosis not present

## 2020-05-11 DIAGNOSIS — Z5111 Encounter for antineoplastic chemotherapy: Secondary | ICD-10-CM | POA: Diagnosis not present

## 2020-05-11 DIAGNOSIS — C3411 Malignant neoplasm of upper lobe, right bronchus or lung: Secondary | ICD-10-CM | POA: Diagnosis not present

## 2020-05-11 DIAGNOSIS — Z5112 Encounter for antineoplastic immunotherapy: Secondary | ICD-10-CM | POA: Diagnosis not present

## 2020-05-11 DIAGNOSIS — C7801 Secondary malignant neoplasm of right lung: Secondary | ICD-10-CM | POA: Diagnosis not present

## 2020-06-01 DIAGNOSIS — C7801 Secondary malignant neoplasm of right lung: Secondary | ICD-10-CM | POA: Diagnosis not present

## 2020-06-01 DIAGNOSIS — Z5111 Encounter for antineoplastic chemotherapy: Secondary | ICD-10-CM | POA: Diagnosis not present

## 2020-06-01 DIAGNOSIS — C772 Secondary and unspecified malignant neoplasm of intra-abdominal lymph nodes: Secondary | ICD-10-CM | POA: Diagnosis not present

## 2020-06-01 DIAGNOSIS — C3411 Malignant neoplasm of upper lobe, right bronchus or lung: Secondary | ICD-10-CM | POA: Diagnosis not present

## 2020-06-01 DIAGNOSIS — Z79899 Other long term (current) drug therapy: Secondary | ICD-10-CM | POA: Diagnosis not present

## 2020-06-01 DIAGNOSIS — C7972 Secondary malignant neoplasm of left adrenal gland: Secondary | ICD-10-CM | POA: Diagnosis not present

## 2020-06-08 MED FILL — TRELEGY ELLIPTA 100-62.5-25: 100-62.5-25 | 30 days supply | Qty: 60 | Fill #4

## 2020-06-19 DIAGNOSIS — I700 Atherosclerosis of aorta: Secondary | ICD-10-CM | POA: Diagnosis not present

## 2020-06-19 DIAGNOSIS — C3492 Malignant neoplasm of unspecified part of left bronchus or lung: Secondary | ICD-10-CM | POA: Diagnosis not present

## 2020-06-19 DIAGNOSIS — C3490 Malignant neoplasm of unspecified part of unspecified bronchus or lung: Secondary | ICD-10-CM | POA: Diagnosis not present

## 2020-06-19 DIAGNOSIS — C7972 Secondary malignant neoplasm of left adrenal gland: Secondary | ICD-10-CM | POA: Diagnosis not present

## 2020-06-19 DIAGNOSIS — J439 Emphysema, unspecified: Secondary | ICD-10-CM | POA: Diagnosis not present

## 2020-06-22 DIAGNOSIS — C7972 Secondary malignant neoplasm of left adrenal gland: Secondary | ICD-10-CM | POA: Diagnosis not present

## 2020-06-22 DIAGNOSIS — Z5111 Encounter for antineoplastic chemotherapy: Secondary | ICD-10-CM | POA: Diagnosis not present

## 2020-06-22 DIAGNOSIS — C7801 Secondary malignant neoplasm of right lung: Secondary | ICD-10-CM | POA: Diagnosis not present

## 2020-06-22 DIAGNOSIS — Z5112 Encounter for antineoplastic immunotherapy: Secondary | ICD-10-CM | POA: Diagnosis not present

## 2020-06-22 DIAGNOSIS — R600 Localized edema: Secondary | ICD-10-CM | POA: Diagnosis not present

## 2020-06-22 DIAGNOSIS — C3411 Malignant neoplasm of upper lobe, right bronchus or lung: Secondary | ICD-10-CM | POA: Diagnosis not present

## 2020-06-22 DIAGNOSIS — Z79899 Other long term (current) drug therapy: Secondary | ICD-10-CM | POA: Diagnosis not present

## 2020-06-22 DIAGNOSIS — C772 Secondary and unspecified malignant neoplasm of intra-abdominal lymph nodes: Secondary | ICD-10-CM | POA: Diagnosis not present

## 2020-06-22 MED FILL — FOLIC ACID 1 MG TABS: 1 | 30 days supply | Qty: 30 | Fill #5

## 2020-06-22 MED FILL — TAMSULOSIN HCL 0.4 MG CAP: 0.4 | 30 days supply | Qty: 60 | Fill #9

## 2020-07-13 DIAGNOSIS — C7801 Secondary malignant neoplasm of right lung: Secondary | ICD-10-CM | POA: Diagnosis not present

## 2020-07-13 DIAGNOSIS — Z79899 Other long term (current) drug therapy: Secondary | ICD-10-CM | POA: Diagnosis not present

## 2020-07-13 DIAGNOSIS — N2889 Other specified disorders of kidney and ureter: Secondary | ICD-10-CM | POA: Diagnosis not present

## 2020-07-13 DIAGNOSIS — J449 Chronic obstructive pulmonary disease, unspecified: Secondary | ICD-10-CM | POA: Diagnosis not present

## 2020-07-13 DIAGNOSIS — F17210 Nicotine dependence, cigarettes, uncomplicated: Secondary | ICD-10-CM | POA: Diagnosis not present

## 2020-07-13 DIAGNOSIS — C7972 Secondary malignant neoplasm of left adrenal gland: Secondary | ICD-10-CM | POA: Diagnosis not present

## 2020-07-13 DIAGNOSIS — C772 Secondary and unspecified malignant neoplasm of intra-abdominal lymph nodes: Secondary | ICD-10-CM | POA: Diagnosis not present

## 2020-07-13 DIAGNOSIS — R918 Other nonspecific abnormal finding of lung field: Secondary | ICD-10-CM | POA: Diagnosis not present

## 2020-07-13 DIAGNOSIS — C3411 Malignant neoplasm of upper lobe, right bronchus or lung: Secondary | ICD-10-CM | POA: Diagnosis not present

## 2020-07-13 DIAGNOSIS — R600 Localized edema: Secondary | ICD-10-CM | POA: Diagnosis not present

## 2020-07-30 MED FILL — FOLIC ACID 1 MG TABS: 1 | 30 days supply | Qty: 30 | Fill #6

## 2020-07-30 MED FILL — TAMSULOSIN HCL 0.4 MG CAP: 0.4 | 30 days supply | Qty: 60 | Fill #0

## 2020-08-03 DIAGNOSIS — F17210 Nicotine dependence, cigarettes, uncomplicated: Secondary | ICD-10-CM | POA: Diagnosis not present

## 2020-08-03 DIAGNOSIS — Z5111 Encounter for antineoplastic chemotherapy: Secondary | ICD-10-CM | POA: Diagnosis not present

## 2020-08-03 DIAGNOSIS — Z79899 Other long term (current) drug therapy: Secondary | ICD-10-CM | POA: Diagnosis not present

## 2020-08-03 DIAGNOSIS — R600 Localized edema: Secondary | ICD-10-CM | POA: Diagnosis not present

## 2020-08-03 DIAGNOSIS — C3411 Malignant neoplasm of upper lobe, right bronchus or lung: Secondary | ICD-10-CM | POA: Diagnosis not present

## 2020-08-03 DIAGNOSIS — C7972 Secondary malignant neoplasm of left adrenal gland: Secondary | ICD-10-CM | POA: Diagnosis not present

## 2020-08-03 DIAGNOSIS — C772 Secondary and unspecified malignant neoplasm of intra-abdominal lymph nodes: Secondary | ICD-10-CM | POA: Diagnosis not present

## 2020-08-03 DIAGNOSIS — C7801 Secondary malignant neoplasm of right lung: Secondary | ICD-10-CM | POA: Diagnosis not present

## 2020-08-03 DIAGNOSIS — N2889 Other specified disorders of kidney and ureter: Secondary | ICD-10-CM | POA: Diagnosis not present

## 2020-08-24 DIAGNOSIS — Z5111 Encounter for antineoplastic chemotherapy: Secondary | ICD-10-CM | POA: Diagnosis not present

## 2020-08-24 DIAGNOSIS — Z79899 Other long term (current) drug therapy: Secondary | ICD-10-CM | POA: Diagnosis not present

## 2020-08-24 DIAGNOSIS — C3411 Malignant neoplasm of upper lobe, right bronchus or lung: Secondary | ICD-10-CM | POA: Diagnosis not present

## 2020-08-24 DIAGNOSIS — C7801 Secondary malignant neoplasm of right lung: Secondary | ICD-10-CM | POA: Diagnosis not present

## 2020-08-24 DIAGNOSIS — C7972 Secondary malignant neoplasm of left adrenal gland: Secondary | ICD-10-CM | POA: Diagnosis not present

## 2020-08-24 DIAGNOSIS — C772 Secondary and unspecified malignant neoplasm of intra-abdominal lymph nodes: Secondary | ICD-10-CM | POA: Diagnosis not present

## 2020-08-24 DIAGNOSIS — F17210 Nicotine dependence, cigarettes, uncomplicated: Secondary | ICD-10-CM | POA: Diagnosis not present

## 2020-08-28 MED FILL — TRELEGY ELLIPTA 100-62.5-25: 100-62.5-25 | 30 days supply | Qty: 60 | Fill #5

## 2020-09-02 ENCOUNTER — Other Ambulatory Visit (HOSPITAL_BASED_OUTPATIENT_CLINIC_OR_DEPARTMENT_OTHER): Payer: Self-pay | Admitting: Hematology and Oncology

## 2020-09-02 MED FILL — FOLIC ACID 1 MG TABS: 1 | 30 days supply | Qty: 30 | Fill #0

## 2020-09-03 ENCOUNTER — Other Ambulatory Visit (HOSPITAL_BASED_OUTPATIENT_CLINIC_OR_DEPARTMENT_OTHER): Payer: Self-pay | Admitting: Nurse Practitioner

## 2020-09-03 MED FILL — TAMSULOSIN HCL 0.4 MG CAP: 0.4 | 30 days supply | Qty: 60 | Fill #0

## 2020-09-14 DIAGNOSIS — C7801 Secondary malignant neoplasm of right lung: Secondary | ICD-10-CM | POA: Diagnosis not present

## 2020-09-14 DIAGNOSIS — J449 Chronic obstructive pulmonary disease, unspecified: Secondary | ICD-10-CM | POA: Diagnosis not present

## 2020-09-14 DIAGNOSIS — Z79899 Other long term (current) drug therapy: Secondary | ICD-10-CM | POA: Diagnosis not present

## 2020-09-14 DIAGNOSIS — C3411 Malignant neoplasm of upper lobe, right bronchus or lung: Secondary | ICD-10-CM | POA: Diagnosis not present

## 2020-09-14 DIAGNOSIS — R600 Localized edema: Secondary | ICD-10-CM | POA: Diagnosis not present

## 2020-09-14 DIAGNOSIS — N2889 Other specified disorders of kidney and ureter: Secondary | ICD-10-CM | POA: Diagnosis not present

## 2020-09-14 DIAGNOSIS — Z5111 Encounter for antineoplastic chemotherapy: Secondary | ICD-10-CM | POA: Diagnosis not present

## 2020-09-14 DIAGNOSIS — C7972 Secondary malignant neoplasm of left adrenal gland: Secondary | ICD-10-CM | POA: Diagnosis not present

## 2020-09-14 DIAGNOSIS — C772 Secondary and unspecified malignant neoplasm of intra-abdominal lymph nodes: Secondary | ICD-10-CM | POA: Diagnosis not present

## 2020-09-17 ENCOUNTER — Other Ambulatory Visit (HOSPITAL_BASED_OUTPATIENT_CLINIC_OR_DEPARTMENT_OTHER): Payer: Self-pay | Admitting: Student

## 2020-09-17 ENCOUNTER — Emergency Department (HOSPITAL_BASED_OUTPATIENT_CLINIC_OR_DEPARTMENT_OTHER)
Admission: EM | Admit: 2020-09-17 | Discharge: 2020-09-17 | Disposition: A | Discharge status: Home / Self Care | Payer: 59 | Attending: Emergency Medicine | Admitting: Emergency Medicine

## 2020-09-17 ENCOUNTER — Other Ambulatory Visit: Payer: Self-pay

## 2020-09-17 ENCOUNTER — Encounter (HOSPITAL_BASED_OUTPATIENT_CLINIC_OR_DEPARTMENT_OTHER): Payer: Self-pay | Admitting: *Deleted

## 2020-09-17 DIAGNOSIS — Z85118 Personal history of other malignant neoplasm of bronchus and lung: Secondary | ICD-10-CM | POA: Diagnosis not present

## 2020-09-17 DIAGNOSIS — M79605 Pain in left leg: Secondary | ICD-10-CM | POA: Insufficient documentation

## 2020-09-17 DIAGNOSIS — J449 Chronic obstructive pulmonary disease, unspecified: Secondary | ICD-10-CM | POA: Diagnosis not present

## 2020-09-17 DIAGNOSIS — Z87891 Personal history of nicotine dependence: Secondary | ICD-10-CM | POA: Insufficient documentation

## 2020-09-17 DIAGNOSIS — M79602 Pain in left arm: Secondary | ICD-10-CM | POA: Insufficient documentation

## 2020-09-17 HISTORY — DX: Malignant neoplasm of unspecified part of unspecified bronchus or lung: C34.90

## 2020-09-17 HISTORY — DX: Emphysema, unspecified: J43.9

## 2020-09-17 HISTORY — DX: Malignant (primary) neoplasm, unspecified: C80.1

## 2020-09-17 MED ORDER — CYCLOBENZAPRINE HCL 10 MG PO TABS: 10.0000 mg | ORAL_TABLET | Freq: Two times a day (BID) | ORAL | 0 refills | Status: DC | PRN

## 2020-09-17 MED FILL — CYCLOBENZAPRINE HCL 10 MG T: 10 | 10 days supply | Qty: 20 | Fill #0

## 2020-09-17 NOTE — Discharge Instructions (Addendum)
You have been seen here for neck and arm pain.  I prescribed you muscle laxatives please take as prescribed.  Please be aware that this can make you sleepy and I would advise to abstain from driving or operating heavy machinery while taking this medication.  I recommend taking over-the-counter pain medications like ibuprofen and/or Tylenol every 6 as needed.  Please follow dosage and on the back of bottle.  I also recommend applying heat to the area and stretching out the muscles as this will help decrease stiffness and pain.  I have given you information on exercises please follow.  Given you information for an orthopedic doctor please contact them for further evaluation management.  Come back to the emergency department if you develop chest pain, shortness of breath, severe abdominal pain, uncontrolled nausea, vomiting, diarrhea.

## 2020-09-17 NOTE — ED Triage Notes (Signed)
Left arm pain x 3 days with radiation into his left leg. Hx of same. States he slept on his arms and feels that is the cause.

## 2020-09-17 NOTE — ED Provider Notes (Signed)
[facility] EMERGENCY DEPARTMENT Provider Note   CSN: [id number] Arrival date & time: 09/17/20  1140     History Chief Complaint  Patient presents with  . Arm Pain    Ricky Arellano is a 64 y.o. male.  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  Patient states he slept on his arm funny Sunday night and since then has had increasing left arm pain which radiates down his arm and sometimes into his leg.  He states the pain is consistent and hurts especially when he moves in a certain way or direction.  He endorses some slight numbness on the dorsal aspect of his left hand.  He states this is happened in the past but generally it goes away on its own.  Patient denies recent falls, hitting his head or being on anticoagulant. Patient is currently receiving chemotherapy and received his last treatment yesterday without any complications.  Patient denies headache, fever, chills, shortness of breath, chest pain, dumping, nausea, vomiting, diarrhea, pedal edema.  Past Medical History:  Diagnosis Date  . Cancer ([place])   . COPD (chronic obstructive pulmonary disease) ([place])   . Emphysema lung ([place])   . Lung cancer ([place])     There are no problems to display for this patient.   Past Surgical History:  Procedure Laterality Date  . TONSILLECTOMY         No family history on file.  Social History   Tobacco Use  . Smoking status: Former Research scientist (life sciences)  . Smokeless tobacco: Never Used  Substance Use Topics  . Alcohol use: No  . Drug use: No    Home Medications Prior to Admission medications   Medication Sig Start Date End Date Taking? Authorizing Provider  acetaminophen (TYLENOL) 325 MG tablet Take 650 mg by mouth every 6 (six) hours as needed.    [provider]  azithromycin (ZITHROMAX) 250 MG tablet Take  1 tablet (250 mg total) by mouth daily. Take first 2 tablets together, then 1 every day until finished. 08/06/18   Robyn Haber, MD  chlorpheniramine-HYDROcodone (TUSSIONEX PENNKINETIC ER) 10-8 MG/5ML SUER Take 5 mLs by mouth at bedtime as needed for cough. 08/06/18   Robyn Haber, MD  cyclobenzaprine (FLEXERIL) 10 MG tablet Take 1 tablet (10 mg total) by mouth 2 (two) times daily as needed for up to 10 days for muscle spasms. 09/17/20 09/27/20  Marcello Fennel, PA-C  HYDROcodone-acetaminophen (NORCO/VICODIN) 5-325 MG tablet Take 1 tablet by mouth every 4 (four) hours as needed. 05/29/19   Isla Pence, MD  ibuprofen (ADVIL,MOTRIN) 200 MG tablet Take 200 mg by mouth every 6 (six) hours as needed.    [provider]    Allergies    Patient has no known allergies.  Review of Systems   Review of Systems  Constitutional: Negative for chills and fever.  HENT: Negative for congestion, tinnitus, trouble swallowing and voice change.   Respiratory: Negative for shortness of breath and wheezing.   Cardiovascular: Negative for chest pain and palpitations.  Gastrointestinal: Negative for abdominal pain, diarrhea, nausea and vomiting.  Genitourinary: Negative for dysuria, enuresis, frequency and hematuria.  Musculoskeletal: Negative for back pain.       Left arm and left leg pain.  Skin: Negative for rash.  Neurological: Negative for dizziness and headaches.  Hematological: Does not bruise/bleed  easily.    Physical Exam Updated Vital Signs BP 136/68   Pulse 85   Temp 98 F (36.7 C) (Oral)   Resp 18   Ht 5\' 8"  (1.727 m)   Wt 83.5 kg   SpO2 93%   BMI 27.98 kg/m   Physical Exam Vitals and nursing note reviewed.  Constitutional:      General: He is not in acute distress.    Appearance: Normal appearance. He is not ill-appearing or diaphoretic.  HENT:     Head: Normocephalic and atraumatic.     Nose: No congestion or rhinorrhea.     Mouth/Throat:     Mouth: Mucous  membranes are moist.     Pharynx: Oropharynx is clear.  Eyes:     General: No visual field deficit or scleral icterus.    Conjunctiva/sclera: Conjunctivae normal.     Pupils: Pupils are equal, round, and reactive to light.  Cardiovascular:     Rate and Rhythm: Normal rate and regular rhythm.     Pulses: Normal pulses.     Heart sounds: No murmur heard.  No friction rub. No gallop.   Pulmonary:     Effort: Pulmonary effort is normal. No respiratory distress.     Breath sounds: No wheezing, rhonchi or rales.  Abdominal:     General: There is no distension.     Palpations: Abdomen is soft.     Tenderness: There is no abdominal tenderness. There is no right CVA tenderness, left CVA tenderness or guarding.  Musculoskeletal:        General: Tenderness present. No swelling.     Cervical back: Normal range of motion. Tenderness present. No rigidity.     Right lower leg: No edema.     Left lower leg: No edema.     Comments: Patient had 5 5 strength, full range of motion and neurovascular fully intact in the upper lower extremities.  Left arm was assessed there is no deformities, ecchymosis or abrasions noted.  He was tender to palpation along his lateral deltoid and left side of his neck.  Patient was able to move his neck without difficulty but was tender to palpation along the left side of his trapezius muscle.    Skin:    General: Skin is warm and dry.     Findings: No rash.  Neurological:     General: No focal deficit present.     Mental Status: He is alert and oriented to person, place, and time.     GCS: GCS eye subscore is 4. GCS verbal subscore is 5. GCS motor subscore is 6.     Cranial Nerves: Cranial nerves are intact. No cranial nerve deficit or facial asymmetry.     Sensory: Sensation is intact. No sensory deficit.     Motor: Motor function is intact. No weakness or pronator drift.     Coordination: Coordination is intact. Romberg sign negative. Finger-Nose-Finger Test and  Heel to [facility] Test normal.  Psychiatric:        Mood and Affect: Mood normal.     ED Results / Procedures / Treatments   Labs (all labs ordered are listed, but only abnormal results are displayed) Labs Reviewed - No data to display  EKG None  Radiology No results found.  Procedures Procedures (including critical care time)  Medications Ordered in ED Medications - No data to display  ED Course  I have reviewed the triage vital signs and the nursing notes.  Pertinent labs &  imaging results that were available during my care of the patient were reviewed by me and considered in my medical decision making (see chart for details).    MDM Rules/Calculators/A&P                          I have personally reviewed all imaging, labs and have interpreted them.  Patient presents with left arm and left leg pain.  He was alert, did not appear in acute distress, vital signs reassuring.  Due to well-appearing patient, benign physical exam and reassuring vital signs further lab work and imaging not warranted at this time.  I have low suspicion for CVA or intracranial head bleed as patient denies recent falls, denies headache, change in vision, paresthesias or weakness the upper lower extremities, neuro exam did not reveal any deficits. I have low suspicion for septic arthritis as patient denies IV drug use, skin exam was performed no erythematous, edematous, warm joints noted on exam, no new heart murmur heard on exam. low suspicion for ligament or tendon damage as area was palpated no gross defects noted, he had full range of motion as well as 5/5 strength in the upper and lower extremities..  Low suspicion for compartment syndrome as area was palpated it was soft to the touch, neurovascular fully intact.  I suspect patient has a muscular strain at his neck causing him to have nerve pain which shoots down his arms and leg.  He describes this pain as electrical in nature.  Will prescribe him muscle  relaxers and have him follow-up with orthopedics for further evaluation management.   Vital signs have remained stable, no indication for hospital admission.  .  Patient given at home care as well strict return precautions.  Patient verbalized that they understood agreed to said plan.  Final Clinical Impression(s) / ED Diagnoses Final diagnoses:  Left arm pain    Rx / DC Orders ED Discharge Orders         Ordered    cyclobenzaprine (FLEXERIL) 10 MG tablet  2 times daily PRN        09/17/20 1235           Marcello Fennel, PA-C 09/17/20 1332    Deno Etienne, DO 09/17/20 1443

## 2020-10-05 DIAGNOSIS — C7801 Secondary malignant neoplasm of right lung: Secondary | ICD-10-CM | POA: Diagnosis not present

## 2020-10-05 DIAGNOSIS — F17210 Nicotine dependence, cigarettes, uncomplicated: Secondary | ICD-10-CM | POA: Diagnosis not present

## 2020-10-05 DIAGNOSIS — N2889 Other specified disorders of kidney and ureter: Secondary | ICD-10-CM | POA: Diagnosis not present

## 2020-10-05 DIAGNOSIS — C3411 Malignant neoplasm of upper lobe, right bronchus or lung: Secondary | ICD-10-CM | POA: Diagnosis not present

## 2020-10-05 DIAGNOSIS — C772 Secondary and unspecified malignant neoplasm of intra-abdominal lymph nodes: Secondary | ICD-10-CM | POA: Diagnosis not present

## 2020-10-05 DIAGNOSIS — Z5111 Encounter for antineoplastic chemotherapy: Secondary | ICD-10-CM | POA: Diagnosis not present

## 2020-10-05 DIAGNOSIS — R600 Localized edema: Secondary | ICD-10-CM | POA: Diagnosis not present

## 2020-10-05 DIAGNOSIS — Z79899 Other long term (current) drug therapy: Secondary | ICD-10-CM | POA: Diagnosis not present

## 2020-10-05 MED FILL — DEXAMETHASONE 4 MG TABLET: 4 | 12 days supply | Qty: 24 | Fill #2

## 2020-10-12 ENCOUNTER — Other Ambulatory Visit (HOSPITAL_BASED_OUTPATIENT_CLINIC_OR_DEPARTMENT_OTHER): Payer: Self-pay | Admitting: *Deleted

## 2020-10-12 MED FILL — TRELEGY ELLIPTA 100-62.5-25: 100-62.5-25 | 30 days supply | Qty: 60 | Fill #0

## 2020-10-19 MED FILL — FOLIC ACID 1 MG TABS: 1 | 30 days supply | Qty: 30 | Fill #1

## 2020-10-19 MED FILL — TAMSULOSIN HCL 0.4 MG CAP: 0.4 | 30 days supply | Qty: 60 | Fill #1

## 2020-10-23 DIAGNOSIS — C7801 Secondary malignant neoplasm of right lung: Secondary | ICD-10-CM | POA: Diagnosis not present

## 2020-10-23 DIAGNOSIS — N289 Disorder of kidney and ureter, unspecified: Secondary | ICD-10-CM | POA: Diagnosis not present

## 2020-10-23 DIAGNOSIS — R911 Solitary pulmonary nodule: Secondary | ICD-10-CM | POA: Diagnosis not present

## 2020-10-23 DIAGNOSIS — I700 Atherosclerosis of aorta: Secondary | ICD-10-CM | POA: Diagnosis not present

## 2020-10-23 DIAGNOSIS — K429 Umbilical hernia without obstruction or gangrene: Secondary | ICD-10-CM | POA: Diagnosis not present

## 2020-10-23 DIAGNOSIS — C3490 Malignant neoplasm of unspecified part of unspecified bronchus or lung: Secondary | ICD-10-CM | POA: Diagnosis not present

## 2020-10-26 DIAGNOSIS — N2889 Other specified disorders of kidney and ureter: Secondary | ICD-10-CM | POA: Diagnosis not present

## 2020-10-26 DIAGNOSIS — C7972 Secondary malignant neoplasm of left adrenal gland: Secondary | ICD-10-CM | POA: Diagnosis not present

## 2020-10-26 DIAGNOSIS — C772 Secondary and unspecified malignant neoplasm of intra-abdominal lymph nodes: Secondary | ICD-10-CM | POA: Diagnosis not present

## 2020-10-26 DIAGNOSIS — Z23 Encounter for immunization: Secondary | ICD-10-CM | POA: Diagnosis not present

## 2020-10-26 DIAGNOSIS — C3411 Malignant neoplasm of upper lobe, right bronchus or lung: Secondary | ICD-10-CM | POA: Diagnosis not present

## 2020-10-26 DIAGNOSIS — Z5111 Encounter for antineoplastic chemotherapy: Secondary | ICD-10-CM | POA: Diagnosis not present

## 2020-10-26 DIAGNOSIS — Z79899 Other long term (current) drug therapy: Secondary | ICD-10-CM | POA: Diagnosis not present

## 2020-10-26 DIAGNOSIS — R600 Localized edema: Secondary | ICD-10-CM | POA: Diagnosis not present

## 2020-10-26 DIAGNOSIS — C7801 Secondary malignant neoplasm of right lung: Secondary | ICD-10-CM | POA: Diagnosis not present

## 2020-10-26 DIAGNOSIS — F17210 Nicotine dependence, cigarettes, uncomplicated: Secondary | ICD-10-CM | POA: Diagnosis not present

## 2020-10-27 DIAGNOSIS — M542 Cervicalgia: Secondary | ICD-10-CM | POA: Diagnosis not present

## 2020-10-27 DIAGNOSIS — M79602 Pain in left arm: Secondary | ICD-10-CM | POA: Diagnosis not present

## 2020-10-30 ENCOUNTER — Other Ambulatory Visit (HOSPITAL_BASED_OUTPATIENT_CLINIC_OR_DEPARTMENT_OTHER): Payer: Self-pay | Admitting: Orthopedic Surgery

## 2020-10-30 MED FILL — predniSONE 10 MG TABS: 10 | 6 days supply | Qty: 21 | Fill #0

## 2020-10-30 MED FILL — METHOCARBAMOL 500 MG TABS: 500 | 30 days supply | Qty: 60 | Fill #0

## 2020-11-16 DIAGNOSIS — C3411 Malignant neoplasm of upper lobe, right bronchus or lung: Secondary | ICD-10-CM | POA: Diagnosis not present

## 2020-11-16 DIAGNOSIS — C772 Secondary and unspecified malignant neoplasm of intra-abdominal lymph nodes: Secondary | ICD-10-CM | POA: Diagnosis not present

## 2020-11-16 DIAGNOSIS — Z5112 Encounter for antineoplastic immunotherapy: Secondary | ICD-10-CM | POA: Diagnosis not present

## 2020-11-16 DIAGNOSIS — Z7952 Long term (current) use of systemic steroids: Secondary | ICD-10-CM | POA: Diagnosis not present

## 2020-11-16 DIAGNOSIS — C7972 Secondary malignant neoplasm of left adrenal gland: Secondary | ICD-10-CM | POA: Diagnosis not present

## 2020-11-19 ENCOUNTER — Other Ambulatory Visit (HOSPITAL_BASED_OUTPATIENT_CLINIC_OR_DEPARTMENT_OTHER): Payer: Self-pay | Admitting: *Deleted

## 2020-11-19 MED FILL — FOLIC ACID 1 MG TABS: 1 | 30 days supply | Qty: 30 | Fill #2

## 2020-11-19 MED FILL — TAMSULOSIN HCL 0.4 MG CAP: 0.4 | 30 days supply | Qty: 60 | Fill #2

## 2020-11-19 MED FILL — TRELEGY ELLIPTA 100-62.5-25: 100-62.5-25 | 30 days supply | Qty: 60 | Fill #0

## 2020-12-10 DIAGNOSIS — C7972 Secondary malignant neoplasm of left adrenal gland: Secondary | ICD-10-CM | POA: Diagnosis not present

## 2020-12-10 DIAGNOSIS — Z79899 Other long term (current) drug therapy: Secondary | ICD-10-CM | POA: Diagnosis not present

## 2020-12-10 DIAGNOSIS — Z5112 Encounter for antineoplastic immunotherapy: Secondary | ICD-10-CM | POA: Diagnosis not present

## 2020-12-10 DIAGNOSIS — Z5111 Encounter for antineoplastic chemotherapy: Secondary | ICD-10-CM | POA: Diagnosis not present

## 2020-12-10 DIAGNOSIS — N2889 Other specified disorders of kidney and ureter: Secondary | ICD-10-CM | POA: Diagnosis not present

## 2020-12-10 DIAGNOSIS — F17210 Nicotine dependence, cigarettes, uncomplicated: Secondary | ICD-10-CM | POA: Diagnosis not present

## 2020-12-10 DIAGNOSIS — C3411 Malignant neoplasm of upper lobe, right bronchus or lung: Secondary | ICD-10-CM | POA: Diagnosis not present

## 2020-12-10 DIAGNOSIS — C772 Secondary and unspecified malignant neoplasm of intra-abdominal lymph nodes: Secondary | ICD-10-CM | POA: Diagnosis not present

## 2020-12-10 DIAGNOSIS — M25473 Effusion, unspecified ankle: Secondary | ICD-10-CM | POA: Diagnosis not present

## 2020-12-10 DIAGNOSIS — R600 Localized edema: Secondary | ICD-10-CM | POA: Diagnosis not present

## 2021-01-01 DIAGNOSIS — Z79899 Other long term (current) drug therapy: Secondary | ICD-10-CM | POA: Diagnosis not present

## 2021-01-01 DIAGNOSIS — C7801 Secondary malignant neoplasm of right lung: Secondary | ICD-10-CM | POA: Diagnosis not present

## 2021-01-01 DIAGNOSIS — R600 Localized edema: Secondary | ICD-10-CM | POA: Diagnosis not present

## 2021-01-01 DIAGNOSIS — N2889 Other specified disorders of kidney and ureter: Secondary | ICD-10-CM | POA: Diagnosis not present

## 2021-01-01 DIAGNOSIS — C3411 Malignant neoplasm of upper lobe, right bronchus or lung: Secondary | ICD-10-CM | POA: Diagnosis not present

## 2021-01-01 DIAGNOSIS — R2240 Localized swelling, mass and lump, unspecified lower limb: Secondary | ICD-10-CM | POA: Diagnosis not present

## 2021-01-01 DIAGNOSIS — C7972 Secondary malignant neoplasm of left adrenal gland: Secondary | ICD-10-CM | POA: Diagnosis not present

## 2021-01-01 DIAGNOSIS — C772 Secondary and unspecified malignant neoplasm of intra-abdominal lymph nodes: Secondary | ICD-10-CM | POA: Diagnosis not present

## 2021-01-01 DIAGNOSIS — N2881 Hypertrophy of kidney: Secondary | ICD-10-CM | POA: Diagnosis not present

## 2021-01-01 DIAGNOSIS — Z5111 Encounter for antineoplastic chemotherapy: Secondary | ICD-10-CM | POA: Diagnosis not present

## 2021-01-13 DIAGNOSIS — F17210 Nicotine dependence, cigarettes, uncomplicated: Secondary | ICD-10-CM | POA: Diagnosis not present

## 2021-01-13 DIAGNOSIS — C7801 Secondary malignant neoplasm of right lung: Secondary | ICD-10-CM | POA: Diagnosis not present

## 2021-01-13 DIAGNOSIS — C3491 Malignant neoplasm of unspecified part of right bronchus or lung: Secondary | ICD-10-CM | POA: Diagnosis not present

## 2021-01-13 DIAGNOSIS — J449 Chronic obstructive pulmonary disease, unspecified: Secondary | ICD-10-CM | POA: Diagnosis not present

## 2021-01-13 DIAGNOSIS — C7972 Secondary malignant neoplasm of left adrenal gland: Secondary | ICD-10-CM | POA: Diagnosis not present

## 2021-01-20 ENCOUNTER — Other Ambulatory Visit (HOSPITAL_BASED_OUTPATIENT_CLINIC_OR_DEPARTMENT_OTHER): Payer: Self-pay | Admitting: Hematology and Oncology

## 2021-01-20 MED FILL — TRELEGY ELLIPTA 100-62.5-25: 100-62.5-25 | 30 days supply | Qty: 60 | Fill #1

## 2021-01-20 MED FILL — DEXAMETHASONE 4 MG TABLET: 4 | 12 days supply | Qty: 24 | Fill #0

## 2021-01-20 MED FILL — TAMSULOSIN HCL 0.4 MG CAP: 0.4 | 30 days supply | Qty: 60 | Fill #3

## 2021-01-20 MED FILL — FOLIC ACID 1 MG TABS: 1 | 30 days supply | Qty: 30 | Fill #3

## 2021-01-22 DIAGNOSIS — C3411 Malignant neoplasm of upper lobe, right bronchus or lung: Secondary | ICD-10-CM | POA: Diagnosis not present

## 2021-01-22 DIAGNOSIS — J302 Other seasonal allergic rhinitis: Secondary | ICD-10-CM | POA: Diagnosis not present

## 2021-01-22 DIAGNOSIS — N2889 Other specified disorders of kidney and ureter: Secondary | ICD-10-CM | POA: Diagnosis not present

## 2021-01-22 DIAGNOSIS — C7801 Secondary malignant neoplasm of right lung: Secondary | ICD-10-CM | POA: Diagnosis not present

## 2021-01-22 DIAGNOSIS — C7972 Secondary malignant neoplasm of left adrenal gland: Secondary | ICD-10-CM | POA: Diagnosis not present

## 2021-01-22 DIAGNOSIS — Z79899 Other long term (current) drug therapy: Secondary | ICD-10-CM | POA: Diagnosis not present

## 2021-01-22 DIAGNOSIS — R600 Localized edema: Secondary | ICD-10-CM | POA: Diagnosis not present

## 2021-01-22 DIAGNOSIS — C772 Secondary and unspecified malignant neoplasm of intra-abdominal lymph nodes: Secondary | ICD-10-CM | POA: Diagnosis not present

## 2021-01-29 MED FILL — DEXAMETHASONE 4 MG TABLET: 4 | 12 days supply | Qty: 24 | Fill #0

## 2021-02-11 DIAGNOSIS — I2510 Atherosclerotic heart disease of native coronary artery without angina pectoris: Secondary | ICD-10-CM | POA: Diagnosis not present

## 2021-02-11 DIAGNOSIS — N289 Disorder of kidney and ureter, unspecified: Secondary | ICD-10-CM | POA: Diagnosis not present

## 2021-02-11 DIAGNOSIS — C3490 Malignant neoplasm of unspecified part of unspecified bronchus or lung: Secondary | ICD-10-CM | POA: Diagnosis not present

## 2021-02-11 DIAGNOSIS — C7801 Secondary malignant neoplasm of right lung: Secondary | ICD-10-CM | POA: Diagnosis not present

## 2021-02-11 DIAGNOSIS — J8410 Pulmonary fibrosis, unspecified: Secondary | ICD-10-CM | POA: Diagnosis not present

## 2021-02-11 DIAGNOSIS — R918 Other nonspecific abnormal finding of lung field: Secondary | ICD-10-CM | POA: Diagnosis not present

## 2021-02-11 DIAGNOSIS — J432 Centrilobular emphysema: Secondary | ICD-10-CM | POA: Diagnosis not present

## 2021-02-11 DIAGNOSIS — I700 Atherosclerosis of aorta: Secondary | ICD-10-CM | POA: Diagnosis not present

## 2021-02-12 DIAGNOSIS — C772 Secondary and unspecified malignant neoplasm of intra-abdominal lymph nodes: Secondary | ICD-10-CM | POA: Diagnosis not present

## 2021-02-12 DIAGNOSIS — N2889 Other specified disorders of kidney and ureter: Secondary | ICD-10-CM | POA: Diagnosis not present

## 2021-02-12 DIAGNOSIS — C3411 Malignant neoplasm of upper lobe, right bronchus or lung: Secondary | ICD-10-CM | POA: Diagnosis not present

## 2021-02-12 DIAGNOSIS — D649 Anemia, unspecified: Secondary | ICD-10-CM | POA: Diagnosis not present

## 2021-02-12 DIAGNOSIS — Z87891 Personal history of nicotine dependence: Secondary | ICD-10-CM | POA: Diagnosis not present

## 2021-02-12 DIAGNOSIS — C7972 Secondary malignant neoplasm of left adrenal gland: Secondary | ICD-10-CM | POA: Diagnosis not present

## 2021-02-12 DIAGNOSIS — E279 Disorder of adrenal gland, unspecified: Secondary | ICD-10-CM | POA: Diagnosis not present

## 2021-02-12 DIAGNOSIS — J302 Other seasonal allergic rhinitis: Secondary | ICD-10-CM | POA: Diagnosis not present

## 2021-02-12 DIAGNOSIS — R0981 Nasal congestion: Secondary | ICD-10-CM | POA: Diagnosis not present

## 2021-02-12 DIAGNOSIS — R600 Localized edema: Secondary | ICD-10-CM | POA: Diagnosis not present

## 2021-02-12 DIAGNOSIS — Z79899 Other long term (current) drug therapy: Secondary | ICD-10-CM | POA: Diagnosis not present

## 2021-02-12 DIAGNOSIS — Z5112 Encounter for antineoplastic immunotherapy: Secondary | ICD-10-CM | POA: Diagnosis not present

## 2021-02-12 DIAGNOSIS — J449 Chronic obstructive pulmonary disease, unspecified: Secondary | ICD-10-CM | POA: Diagnosis not present

## 2021-03-04 DIAGNOSIS — Z5112 Encounter for antineoplastic immunotherapy: Secondary | ICD-10-CM | POA: Diagnosis not present

## 2021-03-04 DIAGNOSIS — J302 Other seasonal allergic rhinitis: Secondary | ICD-10-CM | POA: Diagnosis not present

## 2021-03-04 DIAGNOSIS — C772 Secondary and unspecified malignant neoplasm of intra-abdominal lymph nodes: Secondary | ICD-10-CM | POA: Diagnosis not present

## 2021-03-04 DIAGNOSIS — D649 Anemia, unspecified: Secondary | ICD-10-CM | POA: Diagnosis not present

## 2021-03-04 DIAGNOSIS — N2889 Other specified disorders of kidney and ureter: Secondary | ICD-10-CM | POA: Diagnosis not present

## 2021-03-04 DIAGNOSIS — C7972 Secondary malignant neoplasm of left adrenal gland: Secondary | ICD-10-CM | POA: Diagnosis not present

## 2021-03-04 DIAGNOSIS — R0981 Nasal congestion: Secondary | ICD-10-CM | POA: Diagnosis not present

## 2021-03-04 DIAGNOSIS — R600 Localized edema: Secondary | ICD-10-CM | POA: Diagnosis not present

## 2021-03-04 DIAGNOSIS — C3411 Malignant neoplasm of upper lobe, right bronchus or lung: Secondary | ICD-10-CM | POA: Diagnosis not present

## 2021-03-04 DIAGNOSIS — Z5111 Encounter for antineoplastic chemotherapy: Secondary | ICD-10-CM | POA: Diagnosis not present

## 2021-03-12 ENCOUNTER — Other Ambulatory Visit (HOSPITAL_BASED_OUTPATIENT_CLINIC_OR_DEPARTMENT_OTHER): Payer: Self-pay

## 2021-03-12 MED FILL — Tamsulosin HCl Cap 0.4 MG: ORAL | 30 days supply | Qty: 60 | Fill #0 | Status: AC

## 2021-03-22 MED FILL — Folic Acid Tab 1 MG: ORAL | 30 days supply | Qty: 30 | Fill #0 | Status: AC

## 2021-03-23 ENCOUNTER — Other Ambulatory Visit (HOSPITAL_BASED_OUTPATIENT_CLINIC_OR_DEPARTMENT_OTHER): Payer: Self-pay

## 2021-03-23 DIAGNOSIS — H5203 Hypermetropia, bilateral: Secondary | ICD-10-CM | POA: Diagnosis not present

## 2021-03-23 DIAGNOSIS — H52223 Regular astigmatism, bilateral: Secondary | ICD-10-CM | POA: Diagnosis not present

## 2021-03-23 DIAGNOSIS — H524 Presbyopia: Secondary | ICD-10-CM | POA: Diagnosis not present

## 2021-03-25 DIAGNOSIS — Z7952 Long term (current) use of systemic steroids: Secondary | ICD-10-CM | POA: Diagnosis not present

## 2021-03-25 DIAGNOSIS — C772 Secondary and unspecified malignant neoplasm of intra-abdominal lymph nodes: Secondary | ICD-10-CM | POA: Diagnosis not present

## 2021-03-25 DIAGNOSIS — C7972 Secondary malignant neoplasm of left adrenal gland: Secondary | ICD-10-CM | POA: Diagnosis not present

## 2021-03-25 DIAGNOSIS — C3491 Malignant neoplasm of unspecified part of right bronchus or lung: Secondary | ICD-10-CM | POA: Diagnosis not present

## 2021-03-25 DIAGNOSIS — R0981 Nasal congestion: Secondary | ICD-10-CM | POA: Diagnosis not present

## 2021-03-25 DIAGNOSIS — Z5112 Encounter for antineoplastic immunotherapy: Secondary | ICD-10-CM | POA: Diagnosis not present

## 2021-03-25 DIAGNOSIS — N2889 Other specified disorders of kidney and ureter: Secondary | ICD-10-CM | POA: Diagnosis not present

## 2021-03-25 DIAGNOSIS — C3411 Malignant neoplasm of upper lobe, right bronchus or lung: Secondary | ICD-10-CM | POA: Diagnosis not present

## 2021-03-25 DIAGNOSIS — D649 Anemia, unspecified: Secondary | ICD-10-CM | POA: Diagnosis not present

## 2021-03-25 DIAGNOSIS — Z5111 Encounter for antineoplastic chemotherapy: Secondary | ICD-10-CM | POA: Diagnosis not present

## 2021-04-15 DIAGNOSIS — C7972 Secondary malignant neoplasm of left adrenal gland: Secondary | ICD-10-CM | POA: Diagnosis not present

## 2021-04-15 DIAGNOSIS — J449 Chronic obstructive pulmonary disease, unspecified: Secondary | ICD-10-CM | POA: Diagnosis not present

## 2021-04-15 DIAGNOSIS — Z79899 Other long term (current) drug therapy: Secondary | ICD-10-CM | POA: Diagnosis not present

## 2021-04-15 DIAGNOSIS — R0981 Nasal congestion: Secondary | ICD-10-CM | POA: Diagnosis not present

## 2021-04-15 DIAGNOSIS — C3411 Malignant neoplasm of upper lobe, right bronchus or lung: Secondary | ICD-10-CM | POA: Diagnosis not present

## 2021-04-15 DIAGNOSIS — R911 Solitary pulmonary nodule: Secondary | ICD-10-CM | POA: Diagnosis not present

## 2021-04-15 DIAGNOSIS — Z5112 Encounter for antineoplastic immunotherapy: Secondary | ICD-10-CM | POA: Diagnosis not present

## 2021-04-15 DIAGNOSIS — C772 Secondary and unspecified malignant neoplasm of intra-abdominal lymph nodes: Secondary | ICD-10-CM | POA: Diagnosis not present

## 2021-04-15 DIAGNOSIS — D649 Anemia, unspecified: Secondary | ICD-10-CM | POA: Diagnosis not present

## 2021-04-15 DIAGNOSIS — R600 Localized edema: Secondary | ICD-10-CM | POA: Diagnosis not present

## 2021-04-15 DIAGNOSIS — Z7952 Long term (current) use of systemic steroids: Secondary | ICD-10-CM | POA: Diagnosis not present

## 2021-04-15 DIAGNOSIS — N2889 Other specified disorders of kidney and ureter: Secondary | ICD-10-CM | POA: Diagnosis not present

## 2021-04-22 ENCOUNTER — Other Ambulatory Visit (HOSPITAL_BASED_OUTPATIENT_CLINIC_OR_DEPARTMENT_OTHER): Payer: Self-pay

## 2021-04-22 MED FILL — Fluticasone-Umeclidinium-Vilanterol AEPB 100-62.5-25 MCG/ACT: RESPIRATORY_TRACT | 30 days supply | Qty: 60 | Fill #0 | Status: AC

## 2021-05-06 DIAGNOSIS — N2889 Other specified disorders of kidney and ureter: Secondary | ICD-10-CM | POA: Diagnosis not present

## 2021-05-06 DIAGNOSIS — R6889 Other general symptoms and signs: Secondary | ICD-10-CM | POA: Diagnosis not present

## 2021-05-06 DIAGNOSIS — C772 Secondary and unspecified malignant neoplasm of intra-abdominal lymph nodes: Secondary | ICD-10-CM | POA: Diagnosis not present

## 2021-05-06 DIAGNOSIS — R2243 Localized swelling, mass and lump, lower limb, bilateral: Secondary | ICD-10-CM | POA: Diagnosis not present

## 2021-05-06 DIAGNOSIS — Z09 Encounter for follow-up examination after completed treatment for conditions other than malignant neoplasm: Secondary | ICD-10-CM | POA: Diagnosis not present

## 2021-05-06 DIAGNOSIS — J449 Chronic obstructive pulmonary disease, unspecified: Secondary | ICD-10-CM | POA: Diagnosis not present

## 2021-05-06 DIAGNOSIS — J302 Other seasonal allergic rhinitis: Secondary | ICD-10-CM | POA: Diagnosis not present

## 2021-05-06 DIAGNOSIS — C7972 Secondary malignant neoplasm of left adrenal gland: Secondary | ICD-10-CM | POA: Diagnosis not present

## 2021-05-06 DIAGNOSIS — C3411 Malignant neoplasm of upper lobe, right bronchus or lung: Secondary | ICD-10-CM | POA: Diagnosis not present

## 2021-05-06 DIAGNOSIS — C7801 Secondary malignant neoplasm of right lung: Secondary | ICD-10-CM | POA: Diagnosis not present

## 2021-05-10 DIAGNOSIS — C7801 Secondary malignant neoplasm of right lung: Secondary | ICD-10-CM | POA: Diagnosis not present

## 2021-05-10 DIAGNOSIS — N289 Disorder of kidney and ureter, unspecified: Secondary | ICD-10-CM | POA: Diagnosis not present

## 2021-05-10 DIAGNOSIS — J439 Emphysema, unspecified: Secondary | ICD-10-CM | POA: Diagnosis not present

## 2021-05-10 DIAGNOSIS — R911 Solitary pulmonary nodule: Secondary | ICD-10-CM | POA: Diagnosis not present

## 2021-05-10 DIAGNOSIS — C3490 Malignant neoplasm of unspecified part of unspecified bronchus or lung: Secondary | ICD-10-CM | POA: Diagnosis not present

## 2021-05-10 DIAGNOSIS — I2510 Atherosclerotic heart disease of native coronary artery without angina pectoris: Secondary | ICD-10-CM | POA: Diagnosis not present

## 2021-05-10 DIAGNOSIS — I700 Atherosclerosis of aorta: Secondary | ICD-10-CM | POA: Diagnosis not present

## 2021-05-13 ENCOUNTER — Other Ambulatory Visit (HOSPITAL_BASED_OUTPATIENT_CLINIC_OR_DEPARTMENT_OTHER): Payer: Self-pay

## 2021-05-13 MED FILL — Folic Acid Tab 1 MG: ORAL | 30 days supply | Qty: 30 | Fill #1 | Status: AC

## 2021-05-13 MED FILL — Tamsulosin HCl Cap 0.4 MG: ORAL | 30 days supply | Qty: 60 | Fill #1 | Status: AC

## 2021-05-18 ENCOUNTER — Other Ambulatory Visit (HOSPITAL_COMMUNITY): Payer: Self-pay

## 2021-05-18 MED ORDER — CLINDAMYCIN HCL 150 MG PO CAPS
600.0000 mg | ORAL_CAPSULE | ORAL | 0 refills | Status: AC
  Filled 2021-05-18: qty 4, 1d supply, fill #0

## 2021-05-19 ENCOUNTER — Other Ambulatory Visit (HOSPITAL_BASED_OUTPATIENT_CLINIC_OR_DEPARTMENT_OTHER): Payer: Self-pay

## 2021-05-19 MED ORDER — CLINDAMYCIN HCL 150 MG PO CAPS
ORAL_CAPSULE | ORAL | 0 refills | Status: AC
  Filled 2021-05-19: qty 4, 1d supply, fill #0

## 2021-05-21 ENCOUNTER — Other Ambulatory Visit (HOSPITAL_BASED_OUTPATIENT_CLINIC_OR_DEPARTMENT_OTHER): Payer: Self-pay

## 2021-05-21 MED ORDER — AMOXICILLIN 500 MG PO CAPS
ORAL_CAPSULE | ORAL | 0 refills | Status: AC
  Filled 2021-05-21: qty 15, 5d supply, fill #0

## 2021-05-21 MED ORDER — HYDROCODONE-ACETAMINOPHEN 10-325 MG PO TABS
ORAL_TABLET | ORAL | 0 refills | Status: AC
  Filled 2021-05-21: qty 10, 3d supply, fill #0

## 2021-05-26 ENCOUNTER — Other Ambulatory Visit (HOSPITAL_COMMUNITY): Payer: Self-pay

## 2021-05-27 DIAGNOSIS — R600 Localized edema: Secondary | ICD-10-CM | POA: Diagnosis not present

## 2021-05-27 DIAGNOSIS — N2889 Other specified disorders of kidney and ureter: Secondary | ICD-10-CM | POA: Diagnosis not present

## 2021-05-27 DIAGNOSIS — R0981 Nasal congestion: Secondary | ICD-10-CM | POA: Diagnosis not present

## 2021-05-27 DIAGNOSIS — C7972 Secondary malignant neoplasm of left adrenal gland: Secondary | ICD-10-CM | POA: Diagnosis not present

## 2021-05-27 DIAGNOSIS — Z5112 Encounter for antineoplastic immunotherapy: Secondary | ICD-10-CM | POA: Diagnosis not present

## 2021-05-27 DIAGNOSIS — C3411 Malignant neoplasm of upper lobe, right bronchus or lung: Secondary | ICD-10-CM | POA: Diagnosis not present

## 2021-05-27 DIAGNOSIS — C7801 Secondary malignant neoplasm of right lung: Secondary | ICD-10-CM | POA: Diagnosis not present

## 2021-05-27 DIAGNOSIS — C772 Secondary and unspecified malignant neoplasm of intra-abdominal lymph nodes: Secondary | ICD-10-CM | POA: Diagnosis not present

## 2021-05-27 DIAGNOSIS — J449 Chronic obstructive pulmonary disease, unspecified: Secondary | ICD-10-CM | POA: Diagnosis not present

## 2021-05-27 DIAGNOSIS — J309 Allergic rhinitis, unspecified: Secondary | ICD-10-CM | POA: Diagnosis not present

## 2021-05-27 DIAGNOSIS — D649 Anemia, unspecified: Secondary | ICD-10-CM | POA: Diagnosis not present

## 2021-05-27 DIAGNOSIS — N289 Disorder of kidney and ureter, unspecified: Secondary | ICD-10-CM | POA: Diagnosis not present

## 2021-06-09 ENCOUNTER — Other Ambulatory Visit (HOSPITAL_BASED_OUTPATIENT_CLINIC_OR_DEPARTMENT_OTHER): Payer: Self-pay

## 2021-06-09 MED FILL — Fluticasone-Umeclidinium-Vilanterol AEPB 100-62.5-25 MCG/ACT: RESPIRATORY_TRACT | 30 days supply | Qty: 60 | Fill #1 | Status: AC

## 2021-06-09 MED FILL — Dexamethasone Tab 4 MG: ORAL | 6 days supply | Qty: 24 | Fill #0 | Status: AC

## 2021-06-17 DIAGNOSIS — R0981 Nasal congestion: Secondary | ICD-10-CM | POA: Diagnosis not present

## 2021-06-17 DIAGNOSIS — N289 Disorder of kidney and ureter, unspecified: Secondary | ICD-10-CM | POA: Diagnosis not present

## 2021-06-17 DIAGNOSIS — C7972 Secondary malignant neoplasm of left adrenal gland: Secondary | ICD-10-CM | POA: Diagnosis not present

## 2021-06-17 DIAGNOSIS — Z5112 Encounter for antineoplastic immunotherapy: Secondary | ICD-10-CM | POA: Diagnosis not present

## 2021-06-17 DIAGNOSIS — J449 Chronic obstructive pulmonary disease, unspecified: Secondary | ICD-10-CM | POA: Diagnosis not present

## 2021-06-17 DIAGNOSIS — N2889 Other specified disorders of kidney and ureter: Secondary | ICD-10-CM | POA: Diagnosis not present

## 2021-06-17 DIAGNOSIS — Z7952 Long term (current) use of systemic steroids: Secondary | ICD-10-CM | POA: Diagnosis not present

## 2021-06-17 DIAGNOSIS — C7801 Secondary malignant neoplasm of right lung: Secondary | ICD-10-CM | POA: Diagnosis not present

## 2021-06-17 DIAGNOSIS — C772 Secondary and unspecified malignant neoplasm of intra-abdominal lymph nodes: Secondary | ICD-10-CM | POA: Diagnosis not present

## 2021-06-17 DIAGNOSIS — R600 Localized edema: Secondary | ICD-10-CM | POA: Diagnosis not present

## 2021-06-17 DIAGNOSIS — C3411 Malignant neoplasm of upper lobe, right bronchus or lung: Secondary | ICD-10-CM | POA: Diagnosis not present

## 2021-06-17 DIAGNOSIS — D649 Anemia, unspecified: Secondary | ICD-10-CM | POA: Diagnosis not present

## 2021-06-23 ENCOUNTER — Other Ambulatory Visit (HOSPITAL_BASED_OUTPATIENT_CLINIC_OR_DEPARTMENT_OTHER): Payer: Self-pay

## 2021-06-23 MED FILL — Tamsulosin HCl Cap 0.4 MG: ORAL | 30 days supply | Qty: 60 | Fill #2 | Status: AC

## 2021-06-23 MED FILL — Folic Acid Tab 1 MG: ORAL | 30 days supply | Qty: 30 | Fill #2 | Status: AC

## 2021-07-02 ENCOUNTER — Other Ambulatory Visit (HOSPITAL_BASED_OUTPATIENT_CLINIC_OR_DEPARTMENT_OTHER): Payer: Self-pay

## 2021-07-02 ENCOUNTER — Other Ambulatory Visit: Payer: Self-pay

## 2021-07-02 ENCOUNTER — Emergency Department (HOSPITAL_BASED_OUTPATIENT_CLINIC_OR_DEPARTMENT_OTHER)
Admission: EM | Admit: 2021-07-02 | Discharge: 2021-07-02 | Disposition: A | Discharge status: Home / Self Care | Payer: 59 | Attending: Emergency Medicine | Admitting: Emergency Medicine

## 2021-07-02 DIAGNOSIS — Z87891 Personal history of nicotine dependence: Secondary | ICD-10-CM | POA: Diagnosis not present

## 2021-07-02 DIAGNOSIS — J449 Chronic obstructive pulmonary disease, unspecified: Secondary | ICD-10-CM | POA: Insufficient documentation

## 2021-07-02 DIAGNOSIS — Z7951 Long term (current) use of inhaled steroids: Secondary | ICD-10-CM | POA: Insufficient documentation

## 2021-07-02 DIAGNOSIS — B9689 Other specified bacterial agents as the cause of diseases classified elsewhere: Secondary | ICD-10-CM | POA: Diagnosis not present

## 2021-07-02 DIAGNOSIS — N3001 Acute cystitis with hematuria: Secondary | ICD-10-CM | POA: Diagnosis not present

## 2021-07-02 DIAGNOSIS — R339 Retention of urine, unspecified: Secondary | ICD-10-CM | POA: Diagnosis present

## 2021-07-02 LAB — URINALYSIS, ROUTINE W REFLEX MICROSCOPIC
Bilirubin Urine: NEGATIVE
Glucose, UA: NEGATIVE mg/dL
Ketones, ur: NEGATIVE mg/dL
Leukocytes,Ua: NEGATIVE
Nitrite: POSITIVE — AB
Protein, ur: NEGATIVE mg/dL
Specific Gravity, Urine: <1.005 — ABNORMAL LOW (ref 1.005–1.030)
pH: 6 (ref 5.0–8.0)

## 2021-07-02 LAB — URINALYSIS, MICROSCOPIC (REFLEX)

## 2021-07-02 MED ORDER — CEFPODOXIME PROXETIL 200 MG PO TABS: 200.0000 mg | ORAL_TABLET | Freq: Two times a day (BID) | ORAL | 0 refills | Status: AC

## 2021-07-02 MED ORDER — CEFPODOXIME PROXETIL 200 MG PO TABS
200.0000 mg | ORAL_TABLET | Freq: Two times a day (BID) | ORAL | 0 refills | Status: DC
  Filled 2021-07-02: qty 20, 10d supply, fill #0

## 2021-07-02 NOTE — ED Notes (Signed)
Demonstrated leg bag to standard bag changeover process, educated on how to accomplish this at home. Pt understood instructions. Given extra supplies.

## 2021-07-02 NOTE — ED Triage Notes (Signed)
Pt reports this is the fourth episode of similar. Pt reports last void was 8am this am. Pt reports urgency but minimal output.

## 2021-07-02 NOTE — Discharge Instructions (Addendum)
I prescribed you an antibiotic called cefpodoxime.  Please take this twice a day for the next 10 days for your UTI.  Do not stop taking this medication early.  Below is the contact information for [facility].  Please follow-up with them first thing on Monday morning so that you can schedule an appointment for reevaluation and possibly to have your catheter removed.  If you develop any new or worsening symptoms please come back to the emergency department immediately.  It was a pleasure to meet you.

## 2021-07-02 NOTE — ED Notes (Signed)
Attempted 84fr foley with [place], NT. No urine obtained. Per PA Logan...29fr foley placed

## 2021-07-02 NOTE — ED Provider Notes (Signed)
[facility] EMERGENCY DEPARTMENT Provider Note   CSN: [id number] Arrival date & time: 07/02/21  1232     History Chief Complaint  Patient presents with   Urinary Retention    Ricky Arellano is a 65 y.o. male.  HPI Patient is a 65 year old male with a history of COPD, emphysema, lung cancer, who presents to the emergency department due to urinary retention.  Patient states he last urinated around 8 AM this morning.  Reports a history of intermittent urinary retention in the past and states that he has had a Foley catheter placed 4 times for this previously.  Also notes that he has been evaluated by [facility] in the past for this.  He states that he takes tamsulosin daily but accidentally missed about 2 doses in the past week.  Reports suprapubic pain this morning as well as mild dysuria and urinary frequency yesterday.  Denies any pain along the rectum or perineum.  No fevers, chills, nausea or vomiting.    Past Medical History:  Diagnosis Date   Cancer ([place])    COPD (chronic obstructive pulmonary disease) ([place])    Emphysema lung ([place])    Lung cancer ([place])     There are no problems to display for this patient.   Past Surgical History:  Procedure Laterality Date   TONSILLECTOMY         No family history on file.  Social History   Tobacco Use   Smoking status: Former   Smokeless tobacco: Never  Substance Use Topics   Alcohol use: No   Drug use: No    Home Medications Prior to Admission medications   Medication Sig Start Date End Date Taking? Authorizing Provider  acetaminophen (TYLENOL) 325 MG tablet Take 650 mg by mouth every 6 (six) hours as needed.   Yes [provider]  cefpodoxime (VANTIN) 200 MG tablet Take 1 tablet (200 mg total) by mouth 2 (two) times daily for 10 days. 07/02/21 07/12/21 Yes Rayna Sexton, PA-C  folic acid (FOLVITE) 1 MG tablet TAKE 1 TABLET (1 MG TOTAL) BY MOUTH DAILY. 09/02/20 09/02/21 Yes Zebedee Iba., MD   tamsulosin (FLOMAX) 0.4 MG CAPS capsule TAKE 2 CAPSULES BY MOUTH DAILY 09/03/20 09/03/21 Yes Karen Kays, NP  amoxicillin (AMOXIL) 500 MG capsule Take 1 capsule by mouth 3 times daily until gone 05/21/21     azithromycin (ZITHROMAX) 250 MG tablet Take 1 tablet (250 mg total) by mouth daily. Take first 2 tablets together, then 1 every day until finished. 08/06/18   Robyn Haber, MD  chlorpheniramine-HYDROcodone (TUSSIONEX PENNKINETIC ER) 10-8 MG/5ML SUER Take 5 mLs by mouth at bedtime as needed for cough. 08/06/18   Robyn Haber, MD  clindamycin (CLEOCIN) 150 MG capsule Take 4 capsules (600 mg total) by mouth 1 hour prior to surgery 05/18/21     clindamycin (CLEOCIN) 150 MG capsule take all 4 capsules by mouth 1 hr prior to surgery 05/19/21     cyclobenzaprine (FLEXERIL) 10 MG tablet TAKE 1 TABLET BY MOUTH TWICE DAILY AS NEEDED FOR UP TO 10 DAYS FOR MUSCLE SPASMS 09/17/20 09/17/21  Marcello Fennel, PA-C  dexamethasone (DECADRON) 4 MG tablet TAKE 1 TABLET BY MOUTH 2 TIMES DAILY WITH MEALS FOR 6 DOSES. TAKE THE DAY PRIOR TO, DAY OF, AND DAY AFTER CHEMOTHERAPY 01/20/21 01/20/22  Zebedee Iba., MD  Fluticasone-Umeclidin-Vilant 100-62.5-25 MCG/INH AEPB INHALE 1 PUFF INTO THE LUNGS DAILY FOR 30 DAYS. 11/19/20 11/19/21  Charlaine Dalton, MD  Fluticasone-Umeclidin-Vilant  100-62.5-25 MCG/INH AEPB INHALE 1 PUFF BY MOUTH INTO THE LUNGS DAILY FOR 30 DAYS. 10/12/20 10/12/21  Charlaine Dalton, MD  HYDROcodone-acetaminophen [facility]) 10-325 MG tablet Take 1 tablet by mouth every 6 hours as needed for pain 05/21/21     HYDROcodone-acetaminophen (NORCO/VICODIN) 5-325 MG tablet Take 1 tablet by mouth every 4 (four) hours as needed. 05/29/19   Isla Pence, MD  ibuprofen (ADVIL,MOTRIN) 200 MG tablet Take 200 mg by mouth every 6 (six) hours as needed.    [provider]  methocarbamol (ROBAXIN) 500 MG tablet TAKE 1 TABLET BY MOUTH TWICE DAILY 10/30/20 10/30/21  Netta Cedars, MD  predniSONE  (DELTASONE) 10 MG tablet TAKE 6 TABS BY MOUTH ON DAY 1; 5 TABS ON DAY 2; 4 TABS ON DAY 3; 3 TABS ON DAY 4; 2 TABS ON DAY 5; 1 TAB ON DAY 6 THEN STOP 10/30/20 10/30/21  Netta Cedars, MD    Allergies    Patient has no known allergies.  Review of Systems   Review of Systems  All other systems reviewed and are negative. Ten systems reviewed and are negative for acute change, except as noted in the HPI.   Physical Exam Updated Vital Signs BP (!) 150/83   Pulse 83   Temp 98.3 F (36.8 C) (Oral)   Resp 18   Ht 5\' 7"  (1.702 m)   Wt 83.5 kg   SpO2 91%   BMI 28.82 kg/m   Physical Exam Vitals and nursing note reviewed.  Constitutional:      General: He is not in acute distress.    Appearance: Normal appearance. He is not ill-appearing, toxic-appearing or diaphoretic.  HENT:     Head: Normocephalic and atraumatic.     Right Ear: External ear normal.     Left Ear: External ear normal.     Nose: Nose normal.     Mouth/Throat:     Mouth: Mucous membranes are moist.     Pharynx: Oropharynx is clear. No oropharyngeal exudate or posterior oropharyngeal erythema.  Eyes:     Extraocular Movements: Extraocular movements intact.  Cardiovascular:     Rate and Rhythm: Normal rate and regular rhythm.     Pulses: Normal pulses.     Heart sounds: Normal heart sounds. No murmur heard.   No friction rub. No gallop.  Pulmonary:     Effort: Pulmonary effort is normal. No respiratory distress.     Breath sounds: Normal breath sounds. No stridor. No wheezing, rhonchi or rales.  Abdominal:     General: Abdomen is flat.     Palpations: Abdomen is soft.     Tenderness: There is abdominal tenderness.     Comments: Protuberant abdomen that is soft.  Suprapubic tenderness noted.  Genitourinary:    Comments: Nursing chaperone present.  Normal-appearing circumcised penis.  No rash or lesions.  No penile or scrotal swelling noted. Musculoskeletal:        General: Normal range of motion.     Cervical  back: Normal range of motion and neck supple. No tenderness.  Skin:    General: Skin is warm and dry.  Neurological:     General: No focal deficit present.     Mental Status: He is alert and oriented to person, place, and time.  Psychiatric:        Mood and Affect: Mood normal.        Behavior: Behavior normal.    ED Results / Procedures / Treatments   Labs (all labs  ordered are listed, but only abnormal results are displayed) Labs Reviewed  URINALYSIS, ROUTINE W REFLEX MICROSCOPIC - Abnormal; Notable for the following components:      Result Value   Color, Urine ORANGE (*)    Specific Gravity, Urine <1.005 (*)    Hgb urine dipstick MODERATE (*)    Nitrite POSITIVE (*)    All other components within normal limits  URINALYSIS, MICROSCOPIC (REFLEX) - Abnormal; Notable for the following components:   Bacteria, UA RARE (*)    All other components within normal limits  URINE CULTURE   EKG None  Radiology No results found.  Procedures Procedures   Medications Ordered in ED Medications - No data to display  ED Course  I have reviewed the triage vital signs and the nursing notes.  Pertinent labs & imaging results that were available during my care of the patient were reviewed by me and considered in my medical decision making (see chart for details).  Clinical Course as of 07/02/21 1520  Fri Jul 02, 2021  1355 Bladder scan obtained by nursing staff showing about 600 cc of urine.  Initially attempted a 86 French Foley catheter unsuccessfully.  79 French was placed successfully with good return.  [LJ]  1356 Nitrite(!): POSITIVE [LJ]  1438 Bacteria, UA(!): RARE [LJ]    Clinical Course User Index [LJ] Rayna Sexton, PA-C   MDM Rules/Calculators/A&P                          Pt is a 65 y.o. male who presents to the emergency department due to urinary retention.  Patient has a history of intermittent urinary retention in the past and states he has required a Foley catheter  4 times previously for this.  He is supposed to take Flomax daily but states that he accidentally missed about 2 doses over the past week.  Labs: Urinalysis shows decreased specific gravity, moderate hemoglobin, positive nitrites, rare bacteria. Urine culture sent.  I, Rayna Sexton, PA-C, personally reviewed and evaluated these images and lab results as part of my medical decision-making.  77 French Foley catheter placed by nursing staff successfully.  About 700 cc of urine was removed from the bladder.  Patient notes significant improvement in his symptoms.  Will discharge patient with Foley in place and have him follow [facility] up with [facility] for reevaluation as well as possible removal of the catheter.  Patient also found to have positive nitrites and rare bacteria on UA.  He endorses urinary frequency as well as dysuria that started yesterday.  Will treat with 10 days of cefpodoxime.  Feel the patient is stable for discharge at this time and he is agreeable.  Discussed return precautions in length.  His questions were answered and he was amicable at the time of discharge.  Note: Portions of this report may have been transcribed using voice recognition software. Every effort was made to ensure accuracy; however, inadvertent computerized transcription errors may be present.   Final Clinical Impression(s) / ED Diagnoses Final diagnoses:  Urinary retention  Acute cystitis with hematuria    Rx / DC Orders ED Discharge Orders          Ordered    cefpodoxime (VANTIN) 200 MG tablet  2 times daily        07/02/21 1519             Rayna Sexton, PA-C 07/02/21 1526    Arnaldo Natal, MD 07/02/21 1600

## 2021-07-04 LAB — URINE CULTURE: Culture: NO GROWTH

## 2021-07-06 ENCOUNTER — Other Ambulatory Visit (HOSPITAL_BASED_OUTPATIENT_CLINIC_OR_DEPARTMENT_OTHER): Payer: Self-pay

## 2021-07-06 MED ORDER — LEVOFLOXACIN 500 MG PO TABS
ORAL_TABLET | ORAL | 2 refills | Status: AC
  Filled 2021-07-06: qty 2, 2d supply, fill #0

## 2021-07-08 ENCOUNTER — Other Ambulatory Visit (HOSPITAL_BASED_OUTPATIENT_CLINIC_OR_DEPARTMENT_OTHER): Payer: Self-pay

## 2021-07-08 DIAGNOSIS — N2889 Other specified disorders of kidney and ureter: Secondary | ICD-10-CM | POA: Diagnosis not present

## 2021-07-08 DIAGNOSIS — R339 Retention of urine, unspecified: Secondary | ICD-10-CM | POA: Diagnosis not present

## 2021-07-08 DIAGNOSIS — C3491 Malignant neoplasm of unspecified part of right bronchus or lung: Secondary | ICD-10-CM | POA: Diagnosis not present

## 2021-07-08 DIAGNOSIS — N390 Urinary tract infection, site not specified: Secondary | ICD-10-CM | POA: Diagnosis not present

## 2021-07-08 DIAGNOSIS — Z09 Encounter for follow-up examination after completed treatment for conditions other than malignant neoplasm: Secondary | ICD-10-CM | POA: Diagnosis not present

## 2021-07-08 DIAGNOSIS — C7972 Secondary malignant neoplasm of left adrenal gland: Secondary | ICD-10-CM | POA: Diagnosis not present

## 2021-07-08 DIAGNOSIS — C3411 Malignant neoplasm of upper lobe, right bronchus or lung: Secondary | ICD-10-CM | POA: Diagnosis not present

## 2021-07-08 DIAGNOSIS — C772 Secondary and unspecified malignant neoplasm of intra-abdominal lymph nodes: Secondary | ICD-10-CM | POA: Diagnosis not present

## 2021-07-08 DIAGNOSIS — C7801 Secondary malignant neoplasm of right lung: Secondary | ICD-10-CM | POA: Diagnosis not present

## 2021-07-08 DIAGNOSIS — N139 Obstructive and reflux uropathy, unspecified: Secondary | ICD-10-CM | POA: Diagnosis not present

## 2021-07-08 DIAGNOSIS — D649 Anemia, unspecified: Secondary | ICD-10-CM | POA: Diagnosis not present

## 2021-07-09 DIAGNOSIS — N401 Enlarged prostate with lower urinary tract symptoms: Secondary | ICD-10-CM | POA: Diagnosis not present

## 2021-07-09 DIAGNOSIS — R3914 Feeling of incomplete bladder emptying: Secondary | ICD-10-CM | POA: Diagnosis not present

## 2021-07-09 DIAGNOSIS — R338 Other retention of urine: Secondary | ICD-10-CM | POA: Diagnosis not present

## 2021-07-14 ENCOUNTER — Other Ambulatory Visit: Payer: Self-pay | Admitting: Urology

## 2021-07-14 DIAGNOSIS — J449 Chronic obstructive pulmonary disease, unspecified: Secondary | ICD-10-CM | POA: Diagnosis not present

## 2021-07-14 DIAGNOSIS — C7801 Secondary malignant neoplasm of right lung: Secondary | ICD-10-CM | POA: Diagnosis not present

## 2021-07-14 DIAGNOSIS — R0602 Shortness of breath: Secondary | ICD-10-CM | POA: Diagnosis not present

## 2021-07-14 DIAGNOSIS — N401 Enlarged prostate with lower urinary tract symptoms: Secondary | ICD-10-CM

## 2021-07-14 DIAGNOSIS — F17210 Nicotine dependence, cigarettes, uncomplicated: Secondary | ICD-10-CM | POA: Diagnosis not present

## 2021-07-14 DIAGNOSIS — N138 Other obstructive and reflux uropathy: Secondary | ICD-10-CM

## 2021-07-27 ENCOUNTER — Ambulatory Visit
Admission: RE | Admit: 2021-07-27 | Discharge: 2021-07-27 | Disposition: A | Discharge status: Home / Self Care | Payer: 59 | Source: Ambulatory Visit | Attending: Urology | Admitting: Urology

## 2021-07-27 ENCOUNTER — Other Ambulatory Visit: Payer: Self-pay

## 2021-07-27 DIAGNOSIS — N401 Enlarged prostate with lower urinary tract symptoms: Secondary | ICD-10-CM | POA: Diagnosis not present

## 2021-07-27 DIAGNOSIS — R338 Other retention of urine: Secondary | ICD-10-CM | POA: Insufficient documentation

## 2021-07-27 DIAGNOSIS — N138 Other obstructive and reflux uropathy: Secondary | ICD-10-CM | POA: Insufficient documentation

## 2021-07-27 DIAGNOSIS — R590 Localized enlarged lymph nodes: Secondary | ICD-10-CM | POA: Diagnosis not present

## 2021-07-27 DIAGNOSIS — R339 Retention of urine, unspecified: Secondary | ICD-10-CM | POA: Diagnosis not present

## 2021-07-27 DIAGNOSIS — N402 Nodular prostate without lower urinary tract symptoms: Secondary | ICD-10-CM | POA: Diagnosis not present

## 2021-07-27 MED ORDER — GADOBUTROL 1 MMOL/ML IV SOLN
7.5000 mL | Freq: Once | INTRAVENOUS | Status: AC | PRN
  Administered 2021-07-27: 7.5 mL via INTRAVENOUS

## 2021-07-27 MED ORDER — HEPARIN SOD (PORK) LOCK FLUSH 100 UNIT/ML IV SOLN
500.0000 [IU] | INTRAVENOUS | Status: AC | PRN
Start: 2021-07-27 — End: 2021-07-27
  Administered 2021-07-27: 500 [IU]
  Filled 2021-07-27: qty 5

## 2021-07-28 ENCOUNTER — Other Ambulatory Visit (HOSPITAL_BASED_OUTPATIENT_CLINIC_OR_DEPARTMENT_OTHER): Payer: Self-pay

## 2021-07-28 MED FILL — Fluticasone-Umeclidinium-Vilanterol AEPB 100-62.5-25 MCG/ACT: RESPIRATORY_TRACT | 30 days supply | Qty: 60 | Fill #2 | Status: AC

## 2021-07-28 MED FILL — Tamsulosin HCl Cap 0.4 MG: ORAL | 30 days supply | Qty: 60 | Fill #3 | Status: AC

## 2021-07-29 ENCOUNTER — Other Ambulatory Visit (HOSPITAL_BASED_OUTPATIENT_CLINIC_OR_DEPARTMENT_OTHER): Payer: Self-pay

## 2021-07-29 DIAGNOSIS — J449 Chronic obstructive pulmonary disease, unspecified: Secondary | ICD-10-CM | POA: Diagnosis not present

## 2021-07-29 DIAGNOSIS — R600 Localized edema: Secondary | ICD-10-CM | POA: Diagnosis not present

## 2021-07-29 DIAGNOSIS — R0989 Other specified symptoms and signs involving the circulatory and respiratory systems: Secondary | ICD-10-CM | POA: Diagnosis not present

## 2021-07-29 DIAGNOSIS — C3491 Malignant neoplasm of unspecified part of right bronchus or lung: Secondary | ICD-10-CM | POA: Diagnosis not present

## 2021-07-29 DIAGNOSIS — C7972 Secondary malignant neoplasm of left adrenal gland: Secondary | ICD-10-CM | POA: Diagnosis not present

## 2021-07-29 DIAGNOSIS — C7801 Secondary malignant neoplasm of right lung: Secondary | ICD-10-CM | POA: Diagnosis not present

## 2021-07-29 DIAGNOSIS — D630 Anemia in neoplastic disease: Secondary | ICD-10-CM | POA: Diagnosis not present

## 2021-07-29 DIAGNOSIS — C772 Secondary and unspecified malignant neoplasm of intra-abdominal lymph nodes: Secondary | ICD-10-CM | POA: Diagnosis not present

## 2021-07-29 DIAGNOSIS — C3411 Malignant neoplasm of upper lobe, right bronchus or lung: Secondary | ICD-10-CM | POA: Diagnosis not present

## 2021-07-29 DIAGNOSIS — Z09 Encounter for follow-up examination after completed treatment for conditions other than malignant neoplasm: Secondary | ICD-10-CM | POA: Diagnosis not present

## 2021-07-29 DIAGNOSIS — N390 Urinary tract infection, site not specified: Secondary | ICD-10-CM | POA: Diagnosis not present

## 2021-08-03 ENCOUNTER — Other Ambulatory Visit (HOSPITAL_BASED_OUTPATIENT_CLINIC_OR_DEPARTMENT_OTHER): Payer: Self-pay

## 2021-08-03 DIAGNOSIS — R972 Elevated prostate specific antigen [PSA]: Secondary | ICD-10-CM | POA: Diagnosis not present

## 2021-08-03 MED ORDER — DUTASTERIDE 0.5 MG PO CAPS
ORAL_CAPSULE | ORAL | 3 refills | Status: DC
  Filled 2021-08-03: qty 90, 90d supply, fill #0
  Filled 2021-11-22: qty 90, 90d supply, fill #1
  Filled 2022-04-07: qty 90, 90d supply, fill #2

## 2021-08-04 ENCOUNTER — Other Ambulatory Visit (HOSPITAL_BASED_OUTPATIENT_CLINIC_OR_DEPARTMENT_OTHER): Payer: Self-pay

## 2021-08-05 ENCOUNTER — Other Ambulatory Visit (HOSPITAL_BASED_OUTPATIENT_CLINIC_OR_DEPARTMENT_OTHER): Payer: Self-pay

## 2021-08-06 ENCOUNTER — Other Ambulatory Visit (HOSPITAL_BASED_OUTPATIENT_CLINIC_OR_DEPARTMENT_OTHER): Payer: Self-pay

## 2021-08-06 MED ORDER — FOLIC ACID 1 MG PO TABS
1.0000 mg | ORAL_TABLET | Freq: Every day | ORAL | 6 refills | Status: DC
  Filled 2021-08-06: qty 30, 30d supply, fill #0
  Filled 2021-09-16: qty 30, 30d supply, fill #1
  Filled 2021-10-11: qty 30, 30d supply, fill #2
  Filled 2021-11-22: qty 30, 30d supply, fill #3
  Filled 2022-01-06: qty 30, 30d supply, fill #4
  Filled 2022-02-22: qty 30, 30d supply, fill #5
  Filled 2022-04-07: qty 30, 30d supply, fill #6

## 2021-08-18 ENCOUNTER — Other Ambulatory Visit
Admission: RE | Admit: 2021-08-18 | Discharge: 2021-08-18 | Disposition: A | Discharge status: Home / Self Care | Payer: 59 | Source: Ambulatory Visit | Attending: Urology | Admitting: Urology

## 2021-08-19 DIAGNOSIS — Z95828 Presence of other vascular implants and grafts: Secondary | ICD-10-CM | POA: Diagnosis not present

## 2021-08-19 DIAGNOSIS — C772 Secondary and unspecified malignant neoplasm of intra-abdominal lymph nodes: Secondary | ICD-10-CM | POA: Diagnosis not present

## 2021-08-19 DIAGNOSIS — Z87891 Personal history of nicotine dependence: Secondary | ICD-10-CM | POA: Diagnosis not present

## 2021-08-19 DIAGNOSIS — Z5112 Encounter for antineoplastic immunotherapy: Secondary | ICD-10-CM | POA: Diagnosis not present

## 2021-08-19 DIAGNOSIS — C7972 Secondary malignant neoplasm of left adrenal gland: Secondary | ICD-10-CM | POA: Diagnosis not present

## 2021-08-19 DIAGNOSIS — C7801 Secondary malignant neoplasm of right lung: Secondary | ICD-10-CM | POA: Diagnosis not present

## 2021-08-19 DIAGNOSIS — Z79899 Other long term (current) drug therapy: Secondary | ICD-10-CM | POA: Diagnosis not present

## 2021-08-19 DIAGNOSIS — C3411 Malignant neoplasm of upper lobe, right bronchus or lung: Secondary | ICD-10-CM | POA: Diagnosis not present

## 2021-08-19 DIAGNOSIS — Z5111 Encounter for antineoplastic chemotherapy: Secondary | ICD-10-CM | POA: Diagnosis not present

## 2021-08-19 NOTE — H&P (Signed)
Ricky Arellano, Ricky Arellano MEDICAL RECORD NO: [id number] ACCOUNT NO: [id number] DATE OF BIRTH: [birth year] PHYSICIAN: Otelia Limes. Yves Dill, MD  History and Physical   DATE OF ADMISSION: 08/26/2021  Same day surgery 08/26/2021.  CHIEF COMPLAINT:  Elevated PSA and abnormal prostate gland MRI scan.  HISTORY OF PRESENT ILLNESS:  The patient is a 65 year old African American male with urinary retention and elevated PSA of 33.1 ng/mL.  He was evaluated with prostate gland MRI scan on 09/06 revealing a 207 mL prostate with 2 abnormal lesions.  He had 2  PI-RADS category 3 lesions on the left side.  One lesion was in the left lateral mid gland transitional zone and the other lesion was in the left anterior mid apical prostate zone.  He comes in now for UroNav fusion biopsy of the prostate.  He is on  dutasteride and tamsulosin and also performs intermittent self-catheterization.  PAST MEDICAL HISTORY:  ALLERGIES:  No drug allergies.  CURRENT MEDICATIONS:  Trelegy, folic acid, tamsulosin, loratadine, and dutasteride.  PAST SURGICAL HISTORY:  Tonsillectomy in 1964.  PAST AND CURRENT MEDICAL CONDITIONS: 1.  COPD. 2.  Metastatic lung cancer, status post chemotherapy in 2019.  He has metastasis to the adrenal gland, but now reports complete remission.  REVIEW OF SYSTEMS:  The patient has chronic shortness of breath.  He denied diabetes, stroke, heart disease, chest pain, or hypertension.  SOCIAL HISTORY:  The patient quit smoking in 2020, with a greater than 40-pack-year history.  He consumes 2 alcoholic beverages per week.  FAMILY HISTORY:  Father died at age 54 of pancreatic cancer.  Mother died at age 69 of natural causes.  There is no family history of prostate cancer.  PHYSICAL EXAMINATION: GENERAL:  He was a well-nourished [place] American male in no acute distress. HEENT:  Sclerae were clear.  Pupils were equal, round, reactive to light and accommodation.  Extraocular movements were  intact. NECK:  No palpable masses or tenderness.  No audible carotid bruits. LYMPHATIC:  No palpable cervical or inguinal adenopathy. PULMONARY:  Lungs clear to auscultation. CARDIOVASCULAR:  Regular rhythm and rate. ABDOMEN:  Soft, nontender.  No CVA tenderness. GENITOURINARY: Circumcised.  Testes were smooth, nontender, 18 mL size each. RECTAL: Greater than 50 gram, smooth, nontender prostate, otherwise normal rectal exam.  Urinalysis not done.  No palpable rectal masses. NEUROMUSCULAR:  Alert and oriented x3.  IMPRESSION:    1.  Elevated PSA. 2.  Abnormal prostate gland MRI scan. 3.  Benign prostatic hypertrophy with urinary retention.  PLAN:  UroNav fusion biopsy of the prostate.   ROH D: 08/17/2021 3:04:26 pm T: 08/17/2021 4:43:00 pm  JOB: [id number]/ [id number]

## 2021-08-20 ENCOUNTER — Encounter
Admission: RE | Admit: 2021-08-20 | Discharge: 2021-08-20 | Disposition: A | Discharge status: Home / Self Care | Payer: 59 | Source: Ambulatory Visit | Attending: Urology | Admitting: Urology

## 2021-08-20 ENCOUNTER — Other Ambulatory Visit: Payer: Self-pay

## 2021-08-20 HISTORY — DX: Elevated prostate specific antigen (PSA): R97.20

## 2021-08-20 HISTORY — DX: Unspecified osteoarthritis, unspecified site: M19.90

## 2021-08-20 HISTORY — DX: Personal history of antineoplastic chemotherapy: Z92.21

## 2021-08-20 HISTORY — DX: Gastro-esophageal reflux disease without esophagitis: K21.9

## 2021-08-20 NOTE — Patient Instructions (Addendum)
Your procedure is scheduled on:08-26-21 Thursday Report to the Registration Desk on the 1st floor of the [facility].Then proceed to the 2nd floor Surgery Desk in the [facility] To find out your arrival time, please call [phone] between 1PM - 3PM on:08-25-21 Wednesday  REMEMBER: Instructions that are not followed completely may result in serious medical risk, up to and including death; or upon the discretion of your surgeon and anesthesiologist your surgery may need to be rescheduled.  Do not eat food after midnight the night before surgery.  No gum chewing, lozengers or hard candies.  You may however, drink CLEAR liquids up to 2 hours before you are scheduled to arrive for your surgery. Do not drink anything within 2 hours of your scheduled arrival time.  Clear liquids include: - water  - apple juice without pulp - gatorade (not RED, PURPLE, OR BLUE) - black coffee or tea (Do NOT add milk or creamers to the coffee or tea) Do NOT drink anything that is not on this list.  Do not take any medication the day of surgery  One week prior to surgery: Stop Anti-inflammatories (NSAIDS) such as Advil, Aleve, Ibuprofen, Motrin, Naproxen, Naprosyn and Aspirin based products such as Excedrin, Goodys Powder, BC Powder.You may however, continue to take Tylenol if needed for pain up until the day of surgery.  Stop ANY OVER THE COUNTER supplements/vitamins NOW (08-20-21) until after surgery.  No Alcohol for 24 hours before or after surgery.  No Smoking including e-cigarettes for 24 hours prior to surgery.  No chewable tobacco products for at least 6 hours prior to surgery.  No nicotine patches on the day of surgery.  Do not use any "recreational" drugs for at least a week prior to your surgery.  Please be advised that the combination of cocaine and anesthesia may have negative outcomes, up to and including death. If you test positive for cocaine, your surgery will be cancelled.  On the  morning of surgery brush your teeth with toothpaste and water, you may rinse your mouth with mouthwash if you wish. Do not swallow any toothpaste or mouthwash.  Do not wear jewelry, make-up, hairpins, clips or nail polish.  Do not wear lotions, powders, or perfumes.   Do not shave body from the neck down 48 hours prior to surgery just in case you cut yourself which could leave a site for infection.  Also, freshly shaved skin may become irritated if using the CHG soap.  Contact lenses, hearing aids and dentures may not be worn into surgery.  Do not bring valuables to the hospital. [facility] is not responsible for any missing/lost belongings or valuables.   Fleets enema as directed-Do Fleet Enema at home the morning of surgery 1 hour prior to arrival time to hospital-If you are not cleaned out Dr Yves Dill wants you to repeat Enema (2 are enclosed in your surgery bag)  Notify your doctor if there is any change in your medical condition (cold, fever, infection).  Wear comfortable clothing (specific to your surgery type) to the hospital.  After surgery, you can help prevent lung complications by doing breathing exercises.  Take deep breaths and cough every 1-2 hours. Your doctor may order a device called an Incentive Spirometer to help you take deep breaths. When coughing or sneezing, hold a pillow firmly against your incision with both hands. This is called "splinting." Doing this helps protect your incision. It also decreases belly discomfort.  If you are being admitted to the  hospital overnight, leave your suitcase in the car. After surgery it may be brought to your room.  If you are being discharged the day of surgery, you will not be allowed to drive home. You will need a responsible adult (18 years or older) to drive you home and stay with you that night.   If you are taking public transportation, you will need to have a responsible adult (18 years or older) with you. Please confirm  with your physician that it is acceptable to use public transportation.   Please call the [facility] at [phone] if you have any questions about these instructions.  Surgery Visitation Policy:  Patients undergoing a surgery or procedure may have one family member or support person with them as long as that person is not COVID-19 positive or experiencing its symptoms.  That person may remain in the waiting area during the procedure and may rotate out with other people.  Inpatient Visitation:    Visiting hours are 7 a.m. to 8 p.m. Up to two visitors ages 16+ are allowed at one time in a patient room. The visitors may rotate out with other people during the day. Visitors must check out when they leave, or other visitors will not be allowed. One designated support person may remain overnight. The visitor must pass COVID-19 screenings, use hand sanitizer when entering and exiting the patient's room and wear a mask at all times, including in the patient's room. Patients must also wear a mask when staff or their visitor are in the room. Masking is required regardless of vaccination status.

## 2021-08-24 ENCOUNTER — Other Ambulatory Visit: Payer: Self-pay

## 2021-08-24 ENCOUNTER — Encounter
Admission: RE | Admit: 2021-08-24 | Discharge: 2021-08-24 | Disposition: A | Discharge status: Home / Self Care | Payer: 59 | Source: Ambulatory Visit | Attending: Urology | Admitting: Urology

## 2021-08-24 DIAGNOSIS — Z01810 Encounter for preprocedural cardiovascular examination: Secondary | ICD-10-CM | POA: Diagnosis not present

## 2021-08-26 ENCOUNTER — Encounter
Admission: RE | Disposition: A | Discharge status: Home / Self Care | Payer: Self-pay | Source: Home / Self Care | Attending: Urology

## 2021-08-26 ENCOUNTER — Ambulatory Visit: Payer: 59

## 2021-08-26 ENCOUNTER — Ambulatory Visit
Admission: RE | Admit: 2021-08-26 | Discharge: 2021-08-26 | Disposition: A | Discharge status: Home / Self Care | Payer: 59 | Attending: Urology | Admitting: Urology

## 2021-08-26 ENCOUNTER — Other Ambulatory Visit (HOSPITAL_BASED_OUTPATIENT_CLINIC_OR_DEPARTMENT_OTHER): Payer: Self-pay

## 2021-08-26 ENCOUNTER — Encounter: Payer: Self-pay | Admitting: Urology

## 2021-08-26 ENCOUNTER — Other Ambulatory Visit: Payer: Self-pay

## 2021-08-26 DIAGNOSIS — R972 Elevated prostate specific antigen [PSA]: Secondary | ICD-10-CM | POA: Insufficient documentation

## 2021-08-26 DIAGNOSIS — Z8589 Personal history of malignant neoplasm of other organs and systems: Secondary | ICD-10-CM | POA: Insufficient documentation

## 2021-08-26 DIAGNOSIS — N401 Enlarged prostate with lower urinary tract symptoms: Secondary | ICD-10-CM | POA: Insufficient documentation

## 2021-08-26 DIAGNOSIS — D400 Neoplasm of uncertain behavior of prostate: Secondary | ICD-10-CM | POA: Diagnosis not present

## 2021-08-26 DIAGNOSIS — Z87891 Personal history of nicotine dependence: Secondary | ICD-10-CM | POA: Insufficient documentation

## 2021-08-26 DIAGNOSIS — R338 Other retention of urine: Secondary | ICD-10-CM | POA: Diagnosis not present

## 2021-08-26 DIAGNOSIS — R9389 Abnormal findings on diagnostic imaging of other specified body structures: Secondary | ICD-10-CM | POA: Diagnosis not present

## 2021-08-26 DIAGNOSIS — Z85118 Personal history of other malignant neoplasm of bronchus and lung: Secondary | ICD-10-CM | POA: Diagnosis not present

## 2021-08-26 HISTORY — PX: PROSTATE BIOPSY: SHX241

## 2021-08-26 SURGERY — BIOPSY, PROSTATE
Anesthesia: General

## 2021-08-26 MED ORDER — PROPOFOL 500 MG/50ML IV EMUL
INTRAVENOUS | Status: AC
  Filled 2021-08-26: qty 50

## 2021-08-26 MED ORDER — LACTATED RINGERS IV SOLN: INTRAVENOUS | Status: DC

## 2021-08-26 MED ORDER — FAMOTIDINE 20 MG PO TABS
ORAL_TABLET | ORAL | Status: AC
  Administered 2021-08-26: 20 mg via ORAL
  Filled 2021-08-26: qty 1

## 2021-08-26 MED ORDER — CEFAZOLIN SODIUM-DEXTROSE 1-4 GM/50ML-% IV SOLN
1.0000 g | Freq: Once | INTRAVENOUS | Status: AC
  Administered 2021-08-26: 1 g via INTRAVENOUS

## 2021-08-26 MED ORDER — ORAL CARE MOUTH RINSE: 15.0000 mL | Freq: Once | OROMUCOSAL | Status: AC

## 2021-08-26 MED ORDER — LEVOFLOXACIN 500 MG PO TABS
500.0000 mg | ORAL_TABLET | Freq: Every day | ORAL | 1 refills | Status: AC
  Filled 2021-08-26: qty 7, 7d supply, fill #0

## 2021-08-26 MED ORDER — OXYCODONE HCL 5 MG/5ML PO SOLN: 5.0000 mg | Freq: Once | ORAL | Status: DC | PRN

## 2021-08-26 MED ORDER — PROPOFOL 10 MG/ML IV BOLUS
INTRAVENOUS | Status: DC | PRN
  Administered 2021-08-26: 40 mg via INTRAVENOUS

## 2021-08-26 MED ORDER — CHLORHEXIDINE GLUCONATE 0.12 % MT SOLN: 15.0000 mL | Freq: Once | OROMUCOSAL | Status: AC

## 2021-08-26 MED ORDER — FENTANYL CITRATE (PF) 100 MCG/2ML IJ SOLN: 25.0000 ug | INTRAMUSCULAR | Status: DC | PRN

## 2021-08-26 MED ORDER — FENTANYL CITRATE (PF) 100 MCG/2ML IJ SOLN
INTRAMUSCULAR | Status: AC
  Filled 2021-08-26: qty 2

## 2021-08-26 MED ORDER — ONDANSETRON HCL 4 MG/2ML IJ SOLN: 4.0000 mg | Freq: Once | INTRAMUSCULAR | Status: DC | PRN

## 2021-08-26 MED ORDER — KETAMINE HCL 50 MG/5ML IJ SOSY
PREFILLED_SYRINGE | INTRAMUSCULAR | Status: AC
  Filled 2021-08-26: qty 10

## 2021-08-26 MED ORDER — FLEET ENEMA 7-19 GM/118ML RE ENEM
1.0000 | ENEMA | RECTAL | Status: DC | PRN
  Administered 2021-08-26: 1 via RECTAL

## 2021-08-26 MED ORDER — OXYCODONE HCL 5 MG PO TABS: 5.0000 mg | ORAL_TABLET | Freq: Once | ORAL | Status: DC | PRN

## 2021-08-26 MED ORDER — MIDAZOLAM HCL 2 MG/2ML IJ SOLN
INTRAMUSCULAR | Status: DC | PRN
  Administered 2021-08-26 (×2): 1 mg via INTRAVENOUS

## 2021-08-26 MED ORDER — KETAMINE HCL 10 MG/ML IJ SOLN
INTRAMUSCULAR | Status: DC | PRN
  Administered 2021-08-26: 20 mg via INTRAVENOUS

## 2021-08-26 MED ORDER — FENTANYL CITRATE (PF) 100 MCG/2ML IJ SOLN
INTRAMUSCULAR | Status: DC | PRN
  Administered 2021-08-26 (×2): 25 ug via INTRAVENOUS

## 2021-08-26 MED ORDER — ACETAMINOPHEN 10 MG/ML IV SOLN: 1000.0000 mg | Freq: Once | INTRAVENOUS | Status: DC | PRN

## 2021-08-26 MED ORDER — FAMOTIDINE 20 MG PO TABS: 20.0000 mg | ORAL_TABLET | Freq: Once | ORAL | Status: AC

## 2021-08-26 MED ORDER — GENTAMICIN SULFATE 40 MG/ML IJ SOLN
80.0000 mg | Freq: Once | INTRAVENOUS | Status: AC
  Administered 2021-08-26: 80 mg via INTRAVENOUS
  Filled 2021-08-26: qty 2

## 2021-08-26 MED ORDER — LIDOCAINE HCL (CARDIAC) PF 100 MG/5ML IV SOSY
PREFILLED_SYRINGE | INTRAVENOUS | Status: DC | PRN
  Administered 2021-08-26: 80 mg via INTRAVENOUS

## 2021-08-26 MED ORDER — CHLORHEXIDINE GLUCONATE 0.12 % MT SOLN
OROMUCOSAL | Status: AC
  Administered 2021-08-26: 15 mL via OROMUCOSAL
  Filled 2021-08-26: qty 15

## 2021-08-26 MED ORDER — PROPOFOL 500 MG/50ML IV EMUL
INTRAVENOUS | Status: DC | PRN
  Administered 2021-08-26: 125 ug/kg/min via INTRAVENOUS

## 2021-08-26 MED ORDER — PROPOFOL 10 MG/ML IV BOLUS
INTRAVENOUS | Status: AC
  Filled 2021-08-26: qty 20

## 2021-08-26 MED ORDER — MIDAZOLAM HCL 2 MG/2ML IJ SOLN
INTRAMUSCULAR | Status: AC
  Filled 2021-08-26: qty 2

## 2021-08-26 MED ORDER — CEFAZOLIN SODIUM-DEXTROSE 1-4 GM/50ML-% IV SOLN
INTRAVENOUS | Status: AC
  Filled 2021-08-26: qty 50

## 2021-08-26 SURGICAL SUPPLY — 23 items
COVER MAYO STAND REUSABLE (DRAPES) ×2 IMPLANT
COVER TRANSDUCER ULTRASOUND (MISCELLANEOUS) ×1 IMPLANT
FEE DELIVERY LASER CO2 FORTEC (MISCELLANEOUS) IMPLANT
GLOVE SURG ENC MOIS LTX SZ7 (GLOVE) ×4 IMPLANT
GUIDE NDL ENDOCAV 16-18 CVR (NEEDLE) IMPLANT
GUIDE NEEDLE ENDOCAV 16-18 CVR (NEEDLE) IMPLANT
INST BIOPSY MAXCORE 18GX25 (NEEDLE) ×2 IMPLANT
LASER CO2 FORTEC DELIVERY FEE (MISCELLANEOUS) ×2 IMPLANT
LASER XPS ACCESS DROP OFF FEE (MISCELLANEOUS) IMPLANT
MANIFOLD NEPTUNE II (INSTRUMENTS) ×1 IMPLANT
NDL BIO TRUPATH DISP 18GX25 (MISCELLANEOUS) IMPLANT
NDL GUIDE BIOPSY 644068 (NEEDLE) IMPLANT
NEEDLE BIO TRUPATH DISP 18GX25 (MISCELLANEOUS) ×2 IMPLANT
NEEDLE GUIDE BIOPSY 644068 (NEEDLE) IMPLANT
PROBE BIOSP ALOKA ALPHA6 PROST (MISCELLANEOUS) ×1 IMPLANT
PROBE URONAV BK 8808E 8818 HLD (MISCELLANEOUS) IMPLANT
STRAP SAFETY 5IN WIDE (MISCELLANEOUS) ×2 IMPLANT
SURGILUBE 2OZ TUBE FLIPTOP (MISCELLANEOUS) ×2 IMPLANT
TOWEL OR 17X26 4PK STRL BLUE (TOWEL DISPOSABLE) ×2 IMPLANT
URONAV BK 8808E 8818 PROBE HLD (MISCELLANEOUS) ×2
URONAV MRI FUSION TWO PATIENTS (MISCELLANEOUS) ×1 IMPLANT
URONAV ULTRASOUND (MISCELLANEOUS) ×1 IMPLANT
WATER STERILE IRR 500ML POUR (IV SOLUTION) ×2 IMPLANT

## 2021-08-26 NOTE — Anesthesia Postprocedure Evaluation (Signed)
Anesthesia Post Note  Patient: Ricky Arellano  Procedure(s) Performed: PROSTATE BIOPSY [place]  Patient location during evaluation: PACU Anesthesia Type: General Level of consciousness: awake and alert, oriented and patient cooperative Pain management: pain level controlled Vital Signs Assessment: post-procedure vital signs reviewed and stable Respiratory status: spontaneous breathing, nonlabored ventilation and respiratory function stable Cardiovascular status: blood pressure returned to baseline and stable Postop Assessment: adequate PO intake Anesthetic complications: no   No notable events documented.   Last Vitals:  Vitals:   08/26/21 0913 08/26/21 0918  BP:  135/80  Pulse: 92 94  Resp: (!) 7 13  Temp:  36.7 C  SpO2: 100% 94%    Last Pain:  Vitals:   08/26/21 0918  TempSrc:   PainSc: 0-No pain                 Darrin Nipper

## 2021-08-26 NOTE — Anesthesia Preprocedure Evaluation (Addendum)
Anesthesia Evaluation  Patient identified by MRN, date of birth, ID band Patient awake    Reviewed: Allergy & Precautions, NPO status , Patient's Chart, lab work & pertinent test results  History of Anesthesia Complications Negative for: history of anesthetic complications  Airway Mallampati: I   Neck ROM: Full    Dental  (+) Upper Dentures, Lower Dentures   Pulmonary COPD, former smoker (quit 2020),  Lung cancer with mets to adrenal gland, currently in remission   Pulmonary exam normal breath sounds clear to auscultation       Cardiovascular Exercise Tolerance: Good negative cardio ROS Normal cardiovascular exam Rhythm:Regular Rate:Normal  ECG 08/24/21: Sinus tachycardia; NSST wave abnormality   Neuro/Psych negative neurological ROS     GI/Hepatic GERD  ,  Endo/Other  negative endocrine ROS  Renal/GU negative Renal ROS     Musculoskeletal  (+) Arthritis ,   Abdominal   Peds  Hematology negative hematology ROS (+)   Anesthesia Other Findings   Reproductive/Obstetrics                            Anesthesia Physical Anesthesia Plan  ASA: 3  Anesthesia Plan: General   Post-op Pain Management:    Induction: Intravenous  PONV Risk Score and Plan: 2 and Ondansetron, Dexamethasone, Propofol infusion, TIVA and Treatment may vary due to age or medical condition  Airway Management Planned: Natural Airway  Additional Equipment:   Intra-op Plan:   Post-operative Plan:   Informed Consent: I have reviewed the patients History and Physical, chart, labs and discussed the procedure including the risks, benefits and alternatives for the proposed anesthesia with the patient or authorized representative who has indicated his/her understanding and acceptance.       Plan Discussed with: CRNA  Anesthesia Plan Comments: (GETA backup discussed.)       Anesthesia Quick Evaluation

## 2021-08-26 NOTE — Discharge Instructions (Addendum)
Transrectal Ultrasound-Guided Prostate Biopsy, Care After This sheet gives you information about how to care for yourself after your procedure. Your doctor may also give you more specific instructions. If you have problems or questions, contact your doctor. What can I expect after the procedure? After the procedure, it is common to have: Pain and discomfort in your butt, especially while sitting. Pink-colored pee (urine), due to small amounts of blood in the pee. Burning while peeing (urinating). Blood in your poop (stool). Bleeding from your butt. Blood in your semen. Follow these instructions at home: Medicines Take over-the-counter and prescription medicines only as told by your doctor. If you were prescribed antibiotic medicine, take it as told by your doctor. Do not stop taking the antibiotic even if you start to feel better. Activity Do not drive for 24 hours if you were given a medicine to help you relax (sedative) during your procedure. Return to your normal activities as told by your doctor. Ask your doctor what activities are safe for you. Ask your doctor when it is okay for you to have sex. Do not lift anything that is heavier than 10 lb (4.5 kg), or the limit that you are told, until your doctor says that it is safe. General instructions Drink enough water to keep your pee pale yellow. Watch your pee, poop, and semen for new bleeding or bleeding that gets worse. Keep all follow-up visits as told by your doctor. This is important. Contact a doctor if you: Have blood clots in your pee or poop. Notice that your pee smells bad or unusual. Have very bad belly pain. Have trouble peeing. Notice that your lower belly feels firm. Have blood in your pee for more than 2 weeks after the procedure. Have blood in your semen for more than 2 months after the procedure. Have problems getting an erection. Feel sick to your stomach (nauseous) or throw up (vomit). Have new or worse bleeding  in your pee, poop, or semen. Get help right away if you: Have a fever or chills. Have bright red pee. Have very bad pain that does not get better with medicine. Cannot pee. Summary After this procedure, it is common to have pain and discomfort around your butt, especially while sitting. You may have blood in your pee and poop. It is common to have blood in your semen for 1-2 months. If you were prescribed antibiotic medicine, take it as told by your doctor. Do not stop taking the antibiotic even if you start to feel better. Get help right away if you have a fever or chills. This information is not intended to replace advice given to you by your health care provider. Make sure you discuss any questions you have with your health care provider. Document Revised: 09/21/2020 Document Reviewed: 07/23/2020 Elsevier Patient Education  2022 [place]   The drugs that you were given will stay in your system until tomorrow so for the next 24 hours you should not:  Drive an automobile Make any legal decisions Drink any alcoholic beverage   You may resume regular meals tomorrow.  Today it is better to start with liquids and gradually work up to solid foods.  You may eat anything you prefer, but it is better to start with liquids, then soup and crackers, and gradually work up to solid foods.   Please notify your doctor immediately if you have any unusual bleeding, trouble breathing, redness and pain at the surgery  site, drainage, fever, or pain not relieved by medication.

## 2021-08-26 NOTE — Transfer of Care (Signed)
Immediate Anesthesia Transfer of Care Note  Patient: Ricky Arellano  Procedure(s) Performed: PROSTATE BIOPSY URONAV  Patient Location: PACU  Anesthesia Type:General  Level of Consciousness: drowsy  Airway & Oxygen Therapy: Patient Spontanous Breathing and Patient connected to face mask oxygen  Post-op Assessment: Report given to RN and Post -op Vital signs reviewed and stable  Post vital signs: Reviewed and stable  Last Vitals:  Vitals Value Taken Time  BP 110/73 08/26/21 0848  Temp    Pulse 97 08/26/21 0851  Resp 19 08/26/21 0851  SpO2 97 % 08/26/21 0851  Vitals shown include unvalidated device data.  Last Pain:  Vitals:   08/26/21 0809  TempSrc: Temporal  PainSc: 0-No pain         Complications: No notable events documented.

## 2021-08-26 NOTE — H&P (Signed)
Date of Initial H&P: 08/17/21  History reviewed, patient examined, no change in status, stable for surgery.

## 2021-08-26 NOTE — Op Note (Signed)
Preoperative diagnosis: 1.  Elevated PSA (R97.2)                                           2.  Abnormal prostate gland MRI scan (D40.0)  Postoperative diagnosis: Same  Procedure: Uronav transrectal fusion biopsy of the prostate gland (CPT 55700, [phone])  Surgeon: Otelia Limes. Yves Dill MD  Anesthesia: General  Indications:See the history and physical. After informed consent the above procedure(s) were requested     Technique and findings: After adequate general anesthesia been obtained the patient was placed into left lateral decubitus position and DRE was performed.  The rectal vault was noted to be clean.  The ultrasound probe was placed and images acquired.  The ultrasound images were then fused with the MRI images and region of interest #1 was identified.  4 core biopsies were taken here.  Region of interest #2 was identified and 4 biopsies taken here.  At this point standard 12 core systematic biopsies were performed.  The ultrasound probe was removed.  Blood loss was minimal.  The procedure was then terminated and patient transferred to the recovery room in stable condition.

## 2021-08-30 LAB — SURGICAL PATHOLOGY

## 2021-09-01 DIAGNOSIS — N401 Enlarged prostate with lower urinary tract symptoms: Secondary | ICD-10-CM | POA: Diagnosis not present

## 2021-09-01 DIAGNOSIS — R9721 Rising PSA following treatment for malignant neoplasm of prostate: Secondary | ICD-10-CM | POA: Diagnosis not present

## 2021-09-02 ENCOUNTER — Other Ambulatory Visit (HOSPITAL_BASED_OUTPATIENT_CLINIC_OR_DEPARTMENT_OTHER): Payer: Self-pay

## 2021-09-02 MED FILL — Tamsulosin HCl Cap 0.4 MG: ORAL | 30 days supply | Qty: 60 | Fill #4 | Status: AC

## 2021-09-09 DIAGNOSIS — C772 Secondary and unspecified malignant neoplasm of intra-abdominal lymph nodes: Secondary | ICD-10-CM | POA: Diagnosis not present

## 2021-09-09 DIAGNOSIS — C7801 Secondary malignant neoplasm of right lung: Secondary | ICD-10-CM | POA: Diagnosis not present

## 2021-09-09 DIAGNOSIS — N2889 Other specified disorders of kidney and ureter: Secondary | ICD-10-CM | POA: Diagnosis not present

## 2021-09-09 DIAGNOSIS — D630 Anemia in neoplastic disease: Secondary | ICD-10-CM | POA: Diagnosis not present

## 2021-09-09 DIAGNOSIS — D649 Anemia, unspecified: Secondary | ICD-10-CM | POA: Diagnosis not present

## 2021-09-09 DIAGNOSIS — E278 Other specified disorders of adrenal gland: Secondary | ICD-10-CM | POA: Diagnosis not present

## 2021-09-09 DIAGNOSIS — Z5112 Encounter for antineoplastic immunotherapy: Secondary | ICD-10-CM | POA: Diagnosis not present

## 2021-09-09 DIAGNOSIS — C7972 Secondary malignant neoplasm of left adrenal gland: Secondary | ICD-10-CM | POA: Diagnosis not present

## 2021-09-09 DIAGNOSIS — J309 Allergic rhinitis, unspecified: Secondary | ICD-10-CM | POA: Diagnosis not present

## 2021-09-09 DIAGNOSIS — R600 Localized edema: Secondary | ICD-10-CM | POA: Diagnosis not present

## 2021-09-09 DIAGNOSIS — N139 Obstructive and reflux uropathy, unspecified: Secondary | ICD-10-CM | POA: Diagnosis not present

## 2021-09-09 DIAGNOSIS — C3411 Malignant neoplasm of upper lobe, right bronchus or lung: Secondary | ICD-10-CM | POA: Diagnosis not present

## 2021-09-09 DIAGNOSIS — Z5111 Encounter for antineoplastic chemotherapy: Secondary | ICD-10-CM | POA: Diagnosis not present

## 2021-09-09 DIAGNOSIS — R0981 Nasal congestion: Secondary | ICD-10-CM | POA: Diagnosis not present

## 2021-09-09 DIAGNOSIS — J449 Chronic obstructive pulmonary disease, unspecified: Secondary | ICD-10-CM | POA: Diagnosis not present

## 2021-09-10 IMAGING — MR MR PROSTATE WO/W CM
56 series · 56 of 56 positions shown · IV contrast (7.5ml Gadavist)
Comparison: None

CLINICAL DATA: A 64-year-old male with urinary retention for past 2
years, abnormal ultrasound with PSA of

EXAM:
MR PROSTATE WITHOUT AND WITH CONTRAST
TECHNIQUE: Multiplanar multisequence MRI images were obtained of the pelvis
centered about the prostate. Pre and post contrast images were
obtained.
CONTRAST:  7.5mL GADAVIST GADOBUTROL 1 MMOL/ML IV SOLN

[Series 3: ax in&out whole · axial · 6.0mm · 0.74mm/px · 1 of 35 slices shown (1 of 2)]
[im 1/35]
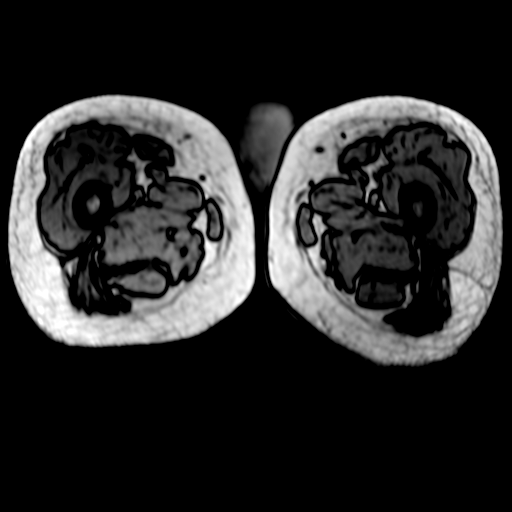

[Series 3: ax in&out whole · axial · 6.0mm · 0.74mm/px · 1 of 35 slices shown (2 of 2)]
[im 1/35]
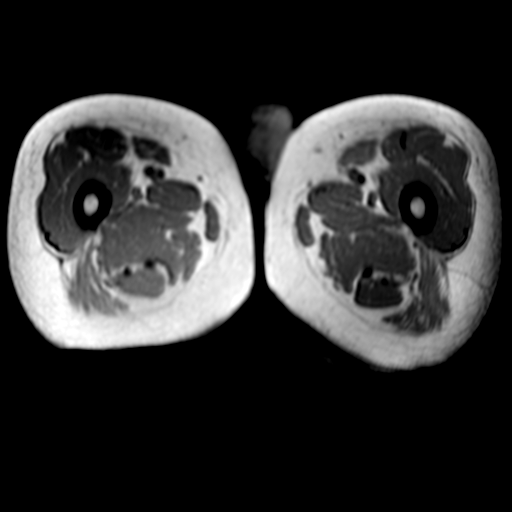

[Series 4: T2 · coronal · 3.0mm · 0.70mm/px · 1 of 39 slices shown (1 of 3)]
[im 1/39]
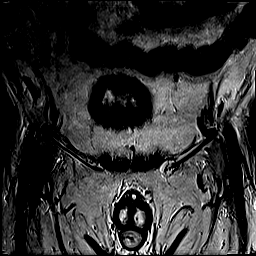

[Series 5: T2 · axial · 3.0mm · 0.56mm/px · 1 of 33 slices shown (2 of 3)]
[im 1/33]
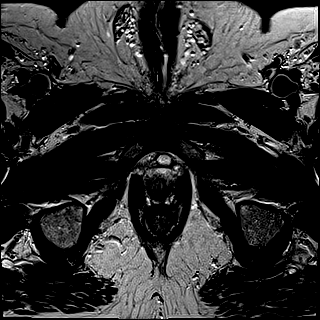

[Series 6: DWI · axial · 3.0mm · 0.86mm/px · 1 of 99 slices shown (1 of 3)]
[im 1/99]
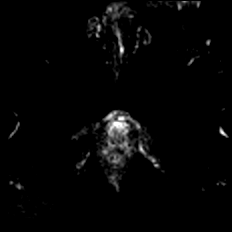

[Series 7: DWI · axial · 3.0mm · 0.86mm/px · 1 of 33 slices shown (2 of 3)]
[im 1/33]
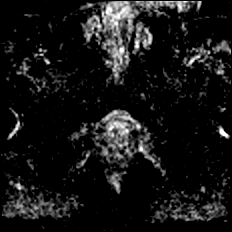

[Series 8: DWI · axial · 3.0mm · 0.86mm/px · 1 of 33 slices shown (3 of 3)]
[im 1/33]
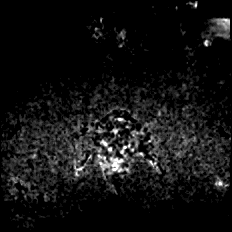

[Series 9: T2 · axial · 1.0mm · 1.04mm/px · 1 of 96 slices shown (3 of 3)]
[im 1/96]
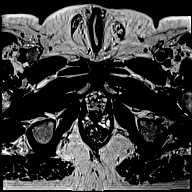

[Series 10: T1 · axial · 3.0mm · 1.15mm/px · 1 of 36 slices shown (1 of 48)]
[im 1/36]
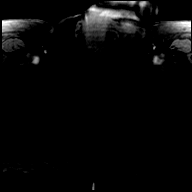

[Series 11: T1 · axial · 3.0mm · 1.15mm/px · 1 of 36 slices shown (2 of 48)]
[im 1/36]
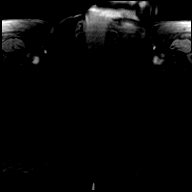

[Series 12: T1 · axial · 3.0mm · 1.15mm/px · 1 of 36 slices shown (3 of 48)]
[im 1/36]
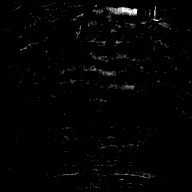

[Series 13: T1 · axial · 3.0mm · 1.15mm/px · 1 of 36 slices shown (4 of 48)]
[im 1/36]
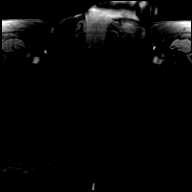

[Series 14: T1 · axial · 3.0mm · 1.15mm/px · 1 of 36 slices shown (5 of 48)]
[im 1/36]
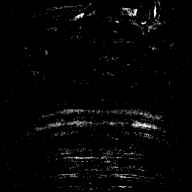

[Series 15: T1 · axial · 3.0mm · 1.15mm/px · 1 of 36 slices shown (6 of 48)]
[im 1/36]
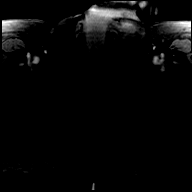

[Series 16: T1 · axial · 3.0mm · 1.15mm/px · 1 of 36 slices shown (7 of 48)]
[im 1/36]
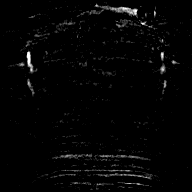

[Series 17: T1 · axial · 3.0mm · 1.15mm/px · 1 of 36 slices shown (8 of 48)]
[im 1/36]
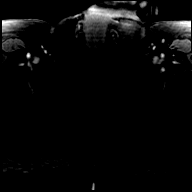

[Series 18: T1 · axial · 3.0mm · 1.15mm/px · 1 of 36 slices shown (9 of 48)]
[im 1/36]
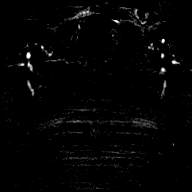

[Series 19: T1 · axial · 3.0mm · 1.15mm/px · 1 of 36 slices shown (10 of 48)]
[im 1/36]
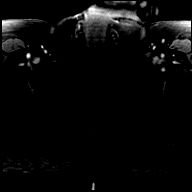

[Series 20: T1 · axial · 3.0mm · 1.15mm/px · 1 of 36 slices shown (11 of 48)]
[im 1/36]
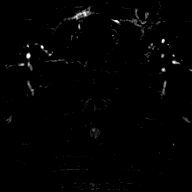

[Series 21: T1 · axial · 3.0mm · 1.15mm/px · 1 of 36 slices shown (12 of 48)]
[im 1/36]
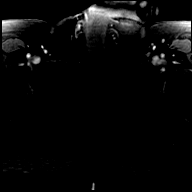

[Series 22: T1 · axial · 3.0mm · 1.15mm/px · 1 of 35 slices shown (13 of 48)]
[im 1/35]
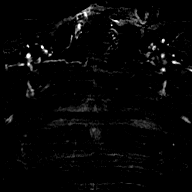

[Series 23: T1 · axial · 3.0mm · 1.15mm/px · 1 of 36 slices shown (14 of 48)]
[im 1/36]
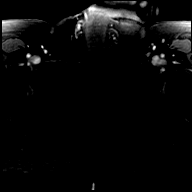

[Series 24: T1 · axial · 3.0mm · 1.15mm/px · 1 of 36 slices shown (15 of 48)]
[im 1/36]
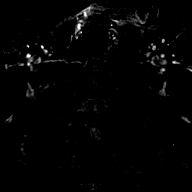

[Series 25: T1 · axial · 3.0mm · 1.15mm/px · 1 of 36 slices shown (16 of 48)]
[im 1/36]
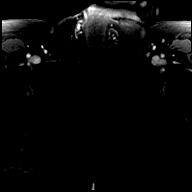

[Series 26: T1 · axial · 3.0mm · 1.15mm/px · 1 of 34 slices shown (17 of 48)]
[im 1/34]
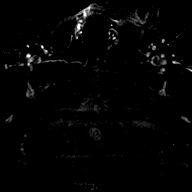

[Series 27: T1 · axial · 3.0mm · 1.15mm/px · 1 of 36 slices shown (18 of 48)]
[im 1/36]
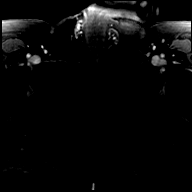

[Series 28: T1 · axial · 3.0mm · 1.15mm/px · 1 of 36 slices shown (19 of 48)]
[im 1/36]
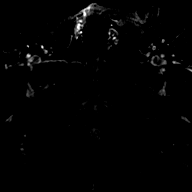

[Series 29: T1 · axial · 3.0mm · 1.15mm/px · 1 of 36 slices shown (20 of 48)]
[im 1/36]
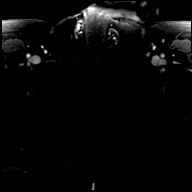

[Series 30: T1 · axial · 3.0mm · 1.15mm/px · 1 of 36 slices shown (21 of 48)]
[im 1/36]
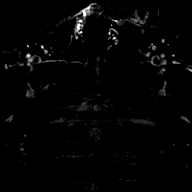

[Series 31: T1 · axial · 3.0mm · 1.15mm/px · 1 of 36 slices shown (22 of 48)]
[im 1/36]
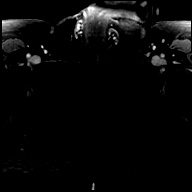

[Series 32: T1 · axial · 3.0mm · 1.15mm/px · 1 of 36 slices shown (23 of 48)]
[im 1/36]
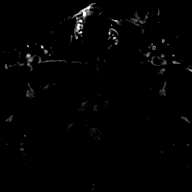

[Series 33: T1 · axial · 3.0mm · 1.15mm/px · 1 of 36 slices shown (24 of 48)]
[im 1/36]
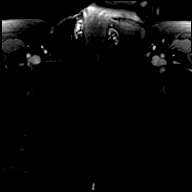

[Series 34: T1 · axial · 3.0mm · 1.15mm/px · 1 of 36 slices shown (25 of 48)]
[im 1/36]
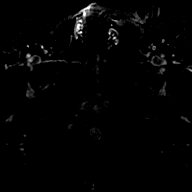

[Series 35: T1 · axial · 3.0mm · 1.15mm/px · 1 of 36 slices shown (26 of 48)]
[im 1/36]
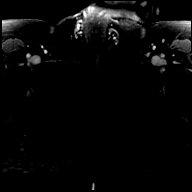

[Series 36: T1 · axial · 3.0mm · 1.15mm/px · 1 of 35 slices shown (27 of 48)]
[im 1/35]
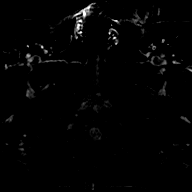

[Series 37: T1 · axial · 3.0mm · 1.15mm/px · 1 of 36 slices shown (28 of 48)]
[im 1/36]
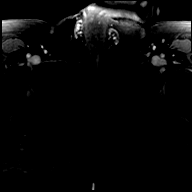

[Series 38: T1 · axial · 3.0mm · 1.15mm/px · 1 of 36 slices shown (29 of 48)]
[im 1/36]
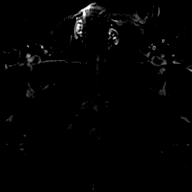

[Series 39: T1 · axial · 3.0mm · 1.15mm/px · 1 of 36 slices shown (30 of 48)]
[im 1/36]
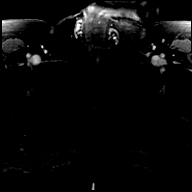

[Series 40: T1 · axial · 3.0mm · 1.15mm/px · 1 of 36 slices shown (31 of 48)]
[im 1/36]
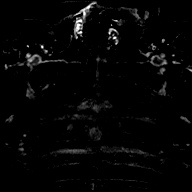

[Series 41: T1 · axial · 3.0mm · 1.15mm/px · 1 of 36 slices shown (32 of 48)]
[im 1/36]
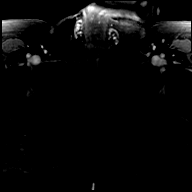

[Series 42: T1 · axial · 3.0mm · 1.15mm/px · 1 of 36 slices shown (33 of 48)]
[im 1/36]
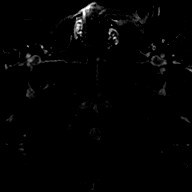

[Series 43: T1 · axial · 3.0mm · 1.15mm/px · 1 of 36 slices shown (34 of 48)]
[im 1/36]
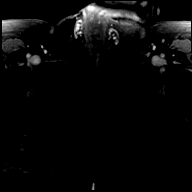

[Series 44: T1 · axial · 3.0mm · 1.15mm/px · 1 of 36 slices shown (35 of 48)]
[im 1/36]
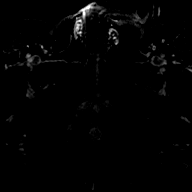

[Series 45: T1 · axial · 3.0mm · 1.15mm/px · 1 of 36 slices shown (36 of 48)]
[im 1/36]
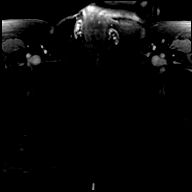

[Series 46: T1 · axial · 3.0mm · 1.15mm/px · 1 of 36 slices shown (37 of 48)]
[im 1/36]
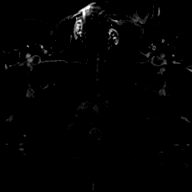

[Series 47: T1 · axial · 3.0mm · 1.15mm/px · 1 of 36 slices shown (38 of 48)]
[im 1/36]
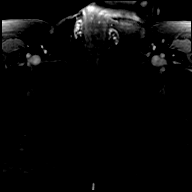

[Series 48: T1 · axial · 3.0mm · 1.15mm/px · 1 of 36 slices shown (39 of 48)]
[im 1/36]
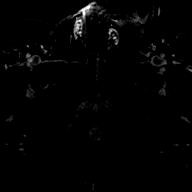

[Series 49: T1 · axial · 3.0mm · 1.15mm/px · 1 of 36 slices shown (40 of 48)]
[im 1/36]
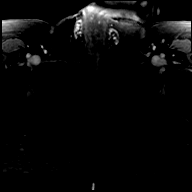

[Series 50: T1 · axial · 3.0mm · 1.15mm/px · 1 of 36 slices shown (41 of 48)]
[im 1/36]
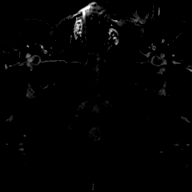

[Series 51: T1 · axial · 3.0mm · 1.15mm/px · 1 of 36 slices shown (42 of 48)]
[im 1/36]
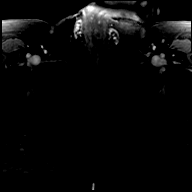

[Series 52: T1 · axial · 3.0mm · 1.15mm/px · 1 of 36 slices shown (43 of 48)]
[im 1/36]
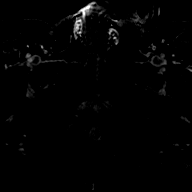

[Series 53: T1 · axial · 3.0mm · 1.15mm/px · 1 of 36 slices shown (44 of 48)]
[im 1/36]
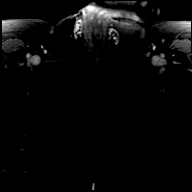

[Series 54: T1 · axial · 3.0mm · 1.15mm/px · 1 of 36 slices shown (45 of 48)]
[im 1/36]
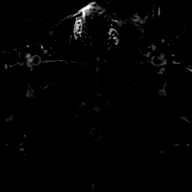

[Series 55: T1 · axial · 3.0mm · 1.15mm/px · 1 of 36 slices shown (46 of 48)]
[im 1/36]
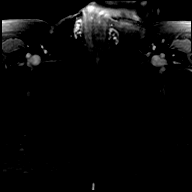

[Series 56: T1 · axial · 3.0mm · 1.15mm/px · 1 of 36 slices shown (47 of 48)]
[im 1/36]
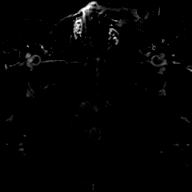

[Series 57: T1 · axial · 3.0mm · 1.15mm/px · 1 of 36 slices shown (48 of 48)]
[im 1/36]
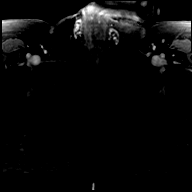

[56 of 56 positions shown; findings below may reference images not displayed]

FINDINGS: Prostate:

Transitional zone: Extensive BPH nodules with marked thinning of the
peripheral zone due to transitional zone hypertrophy. Signs of
extruded BPH nodules along the prostate base to the LEFT of midline
and in the RIGHT mid gland.

T2 hypointensity along the LEFT prostate in the mid gland
transitional zone (image [DATE]) 2 x 2 cm this is homogeneously
markedly T2 hypointense and mainly circumscribed amidst BPH nodules
in the lateral mid gland transitional zone PIRADS category 3.

Area of restricted diffusion in the LEFT anterior mid apical
prostate (image [DATE]) 1.8 cm along the lower margin of a BPH nodule
corresponding to T2 hypointense area. PIRADS category 3.

Peripheral zone: Signs of extruded BPH nodules along the LEFT and
LEFT paramidline prostate base and RIGHT paramidline mid prostate.

Volume: 207 cc.

PSA density 0.16 ng/mlw

Transcapsular spread:  Yes

Seminal vesicle involvement: Absent

Neurovascular bundle involvement: Absent

Pelvic adenopathy: Absent

Bone metastasis: Absent

Other findings: None
IMPRESSION: PIRADS category 3 lesion in the LEFT lateral mid gland transitional
zone.

PIRADS category 3 lesion in the LEFT anterior mid to apical
prostate.

Marked thinning of the peripheral zone due to transitional zone
hypertrophy.

Signs of extruded BPH nodules along the LEFT and LEFT paramidline
prostate base and RIGHT paramidline mid prostate.

## 2021-09-15 ENCOUNTER — Other Ambulatory Visit (HOSPITAL_BASED_OUTPATIENT_CLINIC_OR_DEPARTMENT_OTHER): Payer: Self-pay

## 2021-09-16 ENCOUNTER — Other Ambulatory Visit (HOSPITAL_BASED_OUTPATIENT_CLINIC_OR_DEPARTMENT_OTHER): Payer: Self-pay

## 2021-09-17 ENCOUNTER — Other Ambulatory Visit (HOSPITAL_BASED_OUTPATIENT_CLINIC_OR_DEPARTMENT_OTHER): Payer: Self-pay

## 2021-09-17 MED ORDER — TRELEGY ELLIPTA 100-62.5-25 MCG/ACT IN AEPB
1.0000 | INHALATION_SPRAY | Freq: Every day | RESPIRATORY_TRACT | 5 refills | Status: DC
  Filled 2021-09-17: qty 60, 30d supply, fill #0
  Filled 2021-10-22: qty 60, 30d supply, fill #1
  Filled 2021-11-29: qty 60, 30d supply, fill #2
  Filled 2022-03-11: qty 60, 30d supply, fill #3
  Filled 2022-04-21: qty 60, 30d supply, fill #4
  Filled 2022-05-30: qty 60, 30d supply, fill #5

## 2021-09-28 DIAGNOSIS — I2510 Atherosclerotic heart disease of native coronary artery without angina pectoris: Secondary | ICD-10-CM | POA: Diagnosis not present

## 2021-09-28 DIAGNOSIS — I700 Atherosclerosis of aorta: Secondary | ICD-10-CM | POA: Diagnosis not present

## 2021-09-28 DIAGNOSIS — J439 Emphysema, unspecified: Secondary | ICD-10-CM | POA: Diagnosis not present

## 2021-09-28 DIAGNOSIS — Z85858 Personal history of malignant neoplasm of other endocrine glands: Secondary | ICD-10-CM | POA: Diagnosis not present

## 2021-09-28 DIAGNOSIS — N3289 Other specified disorders of bladder: Secondary | ICD-10-CM | POA: Diagnosis not present

## 2021-09-28 DIAGNOSIS — C7972 Secondary malignant neoplasm of left adrenal gland: Secondary | ICD-10-CM | POA: Diagnosis not present

## 2021-09-28 DIAGNOSIS — C3490 Malignant neoplasm of unspecified part of unspecified bronchus or lung: Secondary | ICD-10-CM | POA: Diagnosis not present

## 2021-09-28 DIAGNOSIS — C3491 Malignant neoplasm of unspecified part of right bronchus or lung: Secondary | ICD-10-CM | POA: Diagnosis not present

## 2021-09-30 DIAGNOSIS — N2889 Other specified disorders of kidney and ureter: Secondary | ICD-10-CM | POA: Diagnosis not present

## 2021-09-30 DIAGNOSIS — C3491 Malignant neoplasm of unspecified part of right bronchus or lung: Secondary | ICD-10-CM | POA: Diagnosis not present

## 2021-09-30 DIAGNOSIS — R600 Localized edema: Secondary | ICD-10-CM | POA: Diagnosis not present

## 2021-09-30 DIAGNOSIS — D630 Anemia in neoplastic disease: Secondary | ICD-10-CM | POA: Diagnosis not present

## 2021-09-30 DIAGNOSIS — C3411 Malignant neoplasm of upper lobe, right bronchus or lung: Secondary | ICD-10-CM | POA: Diagnosis not present

## 2021-09-30 DIAGNOSIS — J449 Chronic obstructive pulmonary disease, unspecified: Secondary | ICD-10-CM | POA: Diagnosis not present

## 2021-09-30 DIAGNOSIS — J309 Allergic rhinitis, unspecified: Secondary | ICD-10-CM | POA: Diagnosis not present

## 2021-09-30 DIAGNOSIS — C7801 Secondary malignant neoplasm of right lung: Secondary | ICD-10-CM | POA: Diagnosis not present

## 2021-09-30 DIAGNOSIS — Z09 Encounter for follow-up examination after completed treatment for conditions other than malignant neoplasm: Secondary | ICD-10-CM | POA: Diagnosis not present

## 2021-09-30 DIAGNOSIS — C7972 Secondary malignant neoplasm of left adrenal gland: Secondary | ICD-10-CM | POA: Diagnosis not present

## 2021-09-30 DIAGNOSIS — C772 Secondary and unspecified malignant neoplasm of intra-abdominal lymph nodes: Secondary | ICD-10-CM | POA: Diagnosis not present

## 2021-10-11 ENCOUNTER — Other Ambulatory Visit (HOSPITAL_BASED_OUTPATIENT_CLINIC_OR_DEPARTMENT_OTHER): Payer: Self-pay

## 2021-10-11 MED ORDER — TAMSULOSIN HCL 0.4 MG PO CAPS
ORAL_CAPSULE | Freq: Every day | ORAL | 11 refills | Status: DC
  Filled 2021-10-11: qty 60, 30d supply, fill #0
  Filled 2021-11-22: qty 60, 30d supply, fill #1
  Filled 2022-01-06: qty 60, 30d supply, fill #2
  Filled 2022-02-22: qty 60, 30d supply, fill #3
  Filled 2022-04-07: qty 60, 30d supply, fill #4
  Filled 2022-05-30: qty 60, 30d supply, fill #5
  Filled 2022-07-12: qty 60, 30d supply, fill #6
  Filled 2022-08-15: qty 60, 30d supply, fill #7
  Filled 2022-10-04: qty 60, 30d supply, fill #8

## 2021-10-21 DIAGNOSIS — N390 Urinary tract infection, site not specified: Secondary | ICD-10-CM | POA: Diagnosis not present

## 2021-10-21 DIAGNOSIS — D630 Anemia in neoplastic disease: Secondary | ICD-10-CM | POA: Diagnosis not present

## 2021-10-21 DIAGNOSIS — C7982 Secondary malignant neoplasm of genital organs: Secondary | ICD-10-CM | POA: Diagnosis not present

## 2021-10-21 DIAGNOSIS — C7972 Secondary malignant neoplasm of left adrenal gland: Secondary | ICD-10-CM | POA: Diagnosis not present

## 2021-10-21 DIAGNOSIS — J449 Chronic obstructive pulmonary disease, unspecified: Secondary | ICD-10-CM | POA: Diagnosis not present

## 2021-10-21 DIAGNOSIS — R600 Localized edema: Secondary | ICD-10-CM | POA: Diagnosis not present

## 2021-10-21 DIAGNOSIS — Z5111 Encounter for antineoplastic chemotherapy: Secondary | ICD-10-CM | POA: Diagnosis not present

## 2021-10-21 DIAGNOSIS — C3411 Malignant neoplasm of upper lobe, right bronchus or lung: Secondary | ICD-10-CM | POA: Diagnosis not present

## 2021-10-21 DIAGNOSIS — D649 Anemia, unspecified: Secondary | ICD-10-CM | POA: Diagnosis not present

## 2021-10-21 DIAGNOSIS — R0981 Nasal congestion: Secondary | ICD-10-CM | POA: Diagnosis not present

## 2021-10-21 DIAGNOSIS — Z5112 Encounter for antineoplastic immunotherapy: Secondary | ICD-10-CM | POA: Diagnosis not present

## 2021-10-21 DIAGNOSIS — J309 Allergic rhinitis, unspecified: Secondary | ICD-10-CM | POA: Diagnosis not present

## 2021-10-21 DIAGNOSIS — C772 Secondary and unspecified malignant neoplasm of intra-abdominal lymph nodes: Secondary | ICD-10-CM | POA: Diagnosis not present

## 2021-10-21 DIAGNOSIS — C7801 Secondary malignant neoplasm of right lung: Secondary | ICD-10-CM | POA: Diagnosis not present

## 2021-10-21 DIAGNOSIS — N2889 Other specified disorders of kidney and ureter: Secondary | ICD-10-CM | POA: Diagnosis not present

## 2021-10-22 ENCOUNTER — Other Ambulatory Visit (HOSPITAL_BASED_OUTPATIENT_CLINIC_OR_DEPARTMENT_OTHER): Payer: Self-pay

## 2021-11-11 DIAGNOSIS — N390 Urinary tract infection, site not specified: Secondary | ICD-10-CM | POA: Diagnosis not present

## 2021-11-11 DIAGNOSIS — C772 Secondary and unspecified malignant neoplasm of intra-abdominal lymph nodes: Secondary | ICD-10-CM | POA: Diagnosis not present

## 2021-11-11 DIAGNOSIS — J449 Chronic obstructive pulmonary disease, unspecified: Secondary | ICD-10-CM | POA: Diagnosis not present

## 2021-11-11 DIAGNOSIS — R600 Localized edema: Secondary | ICD-10-CM | POA: Diagnosis not present

## 2021-11-11 DIAGNOSIS — N2889 Other specified disorders of kidney and ureter: Secondary | ICD-10-CM | POA: Diagnosis not present

## 2021-11-11 DIAGNOSIS — C7982 Secondary malignant neoplasm of genital organs: Secondary | ICD-10-CM | POA: Diagnosis not present

## 2021-11-11 DIAGNOSIS — Z5111 Encounter for antineoplastic chemotherapy: Secondary | ICD-10-CM | POA: Diagnosis not present

## 2021-11-11 DIAGNOSIS — T451X5A Adverse effect of antineoplastic and immunosuppressive drugs, initial encounter: Secondary | ICD-10-CM | POA: Diagnosis not present

## 2021-11-11 DIAGNOSIS — C7801 Secondary malignant neoplasm of right lung: Secondary | ICD-10-CM | POA: Diagnosis not present

## 2021-11-11 DIAGNOSIS — C7972 Secondary malignant neoplasm of left adrenal gland: Secondary | ICD-10-CM | POA: Diagnosis not present

## 2021-11-11 DIAGNOSIS — R0981 Nasal congestion: Secondary | ICD-10-CM | POA: Diagnosis not present

## 2021-11-11 DIAGNOSIS — D6481 Anemia due to antineoplastic chemotherapy: Secondary | ICD-10-CM | POA: Diagnosis not present

## 2021-11-11 DIAGNOSIS — Z5112 Encounter for antineoplastic immunotherapy: Secondary | ICD-10-CM | POA: Diagnosis not present

## 2021-11-11 DIAGNOSIS — J309 Allergic rhinitis, unspecified: Secondary | ICD-10-CM | POA: Diagnosis not present

## 2021-11-11 DIAGNOSIS — D630 Anemia in neoplastic disease: Secondary | ICD-10-CM | POA: Diagnosis not present

## 2021-11-11 DIAGNOSIS — C3411 Malignant neoplasm of upper lobe, right bronchus or lung: Secondary | ICD-10-CM | POA: Diagnosis not present

## 2021-11-22 ENCOUNTER — Other Ambulatory Visit (HOSPITAL_BASED_OUTPATIENT_CLINIC_OR_DEPARTMENT_OTHER): Payer: Self-pay

## 2021-11-22 MED FILL — Dexamethasone Tab 4 MG: ORAL | 6 days supply | Qty: 24 | Fill #1 | Status: AC

## 2021-11-23 ENCOUNTER — Other Ambulatory Visit (HOSPITAL_BASED_OUTPATIENT_CLINIC_OR_DEPARTMENT_OTHER): Payer: Self-pay

## 2021-11-26 DIAGNOSIS — C3490 Malignant neoplasm of unspecified part of unspecified bronchus or lung: Secondary | ICD-10-CM | POA: Diagnosis not present

## 2021-11-26 DIAGNOSIS — C3491 Malignant neoplasm of unspecified part of right bronchus or lung: Secondary | ICD-10-CM | POA: Diagnosis not present

## 2021-11-29 ENCOUNTER — Other Ambulatory Visit (HOSPITAL_BASED_OUTPATIENT_CLINIC_OR_DEPARTMENT_OTHER): Payer: Self-pay

## 2021-12-01 ENCOUNTER — Other Ambulatory Visit (HOSPITAL_BASED_OUTPATIENT_CLINIC_OR_DEPARTMENT_OTHER): Payer: Self-pay

## 2021-12-02 DIAGNOSIS — C7972 Secondary malignant neoplasm of left adrenal gland: Secondary | ICD-10-CM | POA: Diagnosis not present

## 2021-12-02 DIAGNOSIS — C772 Secondary and unspecified malignant neoplasm of intra-abdominal lymph nodes: Secondary | ICD-10-CM | POA: Diagnosis not present

## 2021-12-02 DIAGNOSIS — R0981 Nasal congestion: Secondary | ICD-10-CM | POA: Diagnosis not present

## 2021-12-02 DIAGNOSIS — C7801 Secondary malignant neoplasm of right lung: Secondary | ICD-10-CM | POA: Diagnosis not present

## 2021-12-02 DIAGNOSIS — R600 Localized edema: Secondary | ICD-10-CM | POA: Diagnosis not present

## 2021-12-02 DIAGNOSIS — J449 Chronic obstructive pulmonary disease, unspecified: Secondary | ICD-10-CM | POA: Diagnosis not present

## 2021-12-02 DIAGNOSIS — Z5112 Encounter for antineoplastic immunotherapy: Secondary | ICD-10-CM | POA: Diagnosis not present

## 2021-12-02 DIAGNOSIS — D72819 Decreased white blood cell count, unspecified: Secondary | ICD-10-CM | POA: Diagnosis not present

## 2021-12-02 DIAGNOSIS — T451X5D Adverse effect of antineoplastic and immunosuppressive drugs, subsequent encounter: Secondary | ICD-10-CM | POA: Diagnosis not present

## 2021-12-02 DIAGNOSIS — Z5111 Encounter for antineoplastic chemotherapy: Secondary | ICD-10-CM | POA: Diagnosis not present

## 2021-12-02 DIAGNOSIS — N2889 Other specified disorders of kidney and ureter: Secondary | ICD-10-CM | POA: Diagnosis not present

## 2021-12-02 DIAGNOSIS — C3411 Malignant neoplasm of upper lobe, right bronchus or lung: Secondary | ICD-10-CM | POA: Diagnosis not present

## 2021-12-02 DIAGNOSIS — D6481 Anemia due to antineoplastic chemotherapy: Secondary | ICD-10-CM | POA: Diagnosis not present

## 2021-12-23 DIAGNOSIS — C772 Secondary and unspecified malignant neoplasm of intra-abdominal lymph nodes: Secondary | ICD-10-CM | POA: Diagnosis not present

## 2021-12-23 DIAGNOSIS — C3411 Malignant neoplasm of upper lobe, right bronchus or lung: Secondary | ICD-10-CM | POA: Diagnosis not present

## 2021-12-23 DIAGNOSIS — Z5112 Encounter for antineoplastic immunotherapy: Secondary | ICD-10-CM | POA: Diagnosis not present

## 2021-12-23 DIAGNOSIS — C7801 Secondary malignant neoplasm of right lung: Secondary | ICD-10-CM | POA: Diagnosis not present

## 2021-12-23 DIAGNOSIS — C7972 Secondary malignant neoplasm of left adrenal gland: Secondary | ICD-10-CM | POA: Diagnosis not present

## 2021-12-23 DIAGNOSIS — Z5111 Encounter for antineoplastic chemotherapy: Secondary | ICD-10-CM | POA: Diagnosis not present

## 2022-01-06 ENCOUNTER — Other Ambulatory Visit (HOSPITAL_BASED_OUTPATIENT_CLINIC_OR_DEPARTMENT_OTHER): Payer: Self-pay

## 2022-01-12 DIAGNOSIS — I700 Atherosclerosis of aorta: Secondary | ICD-10-CM | POA: Diagnosis not present

## 2022-01-12 DIAGNOSIS — J439 Emphysema, unspecified: Secondary | ICD-10-CM | POA: Diagnosis not present

## 2022-01-12 DIAGNOSIS — K3189 Other diseases of stomach and duodenum: Secondary | ICD-10-CM | POA: Diagnosis not present

## 2022-01-12 DIAGNOSIS — J929 Pleural plaque without asbestos: Secondary | ICD-10-CM | POA: Diagnosis not present

## 2022-01-12 DIAGNOSIS — C7800 Secondary malignant neoplasm of unspecified lung: Secondary | ICD-10-CM | POA: Diagnosis not present

## 2022-01-12 DIAGNOSIS — J432 Centrilobular emphysema: Secondary | ICD-10-CM | POA: Diagnosis not present

## 2022-01-12 DIAGNOSIS — I2510 Atherosclerotic heart disease of native coronary artery without angina pectoris: Secondary | ICD-10-CM | POA: Diagnosis not present

## 2022-01-12 DIAGNOSIS — K6389 Other specified diseases of intestine: Secondary | ICD-10-CM | POA: Diagnosis not present

## 2022-01-12 DIAGNOSIS — N400 Enlarged prostate without lower urinary tract symptoms: Secondary | ICD-10-CM | POA: Diagnosis not present

## 2022-01-12 DIAGNOSIS — N289 Disorder of kidney and ureter, unspecified: Secondary | ICD-10-CM | POA: Diagnosis not present

## 2022-01-12 DIAGNOSIS — R911 Solitary pulmonary nodule: Secondary | ICD-10-CM | POA: Diagnosis not present

## 2022-01-12 DIAGNOSIS — N2889 Other specified disorders of kidney and ureter: Secondary | ICD-10-CM | POA: Diagnosis not present

## 2022-01-12 DIAGNOSIS — N3289 Other specified disorders of bladder: Secondary | ICD-10-CM | POA: Diagnosis not present

## 2022-01-12 DIAGNOSIS — C3490 Malignant neoplasm of unspecified part of unspecified bronchus or lung: Secondary | ICD-10-CM | POA: Diagnosis not present

## 2022-01-13 DIAGNOSIS — B9689 Other specified bacterial agents as the cause of diseases classified elsewhere: Secondary | ICD-10-CM | POA: Diagnosis not present

## 2022-01-13 DIAGNOSIS — D649 Anemia, unspecified: Secondary | ICD-10-CM | POA: Diagnosis not present

## 2022-01-13 DIAGNOSIS — R0981 Nasal congestion: Secondary | ICD-10-CM | POA: Diagnosis not present

## 2022-01-13 DIAGNOSIS — N138 Other obstructive and reflux uropathy: Secondary | ICD-10-CM | POA: Diagnosis not present

## 2022-01-13 DIAGNOSIS — R972 Elevated prostate specific antigen [PSA]: Secondary | ICD-10-CM | POA: Diagnosis not present

## 2022-01-13 DIAGNOSIS — R600 Localized edema: Secondary | ICD-10-CM | POA: Diagnosis not present

## 2022-01-13 DIAGNOSIS — I2510 Atherosclerotic heart disease of native coronary artery without angina pectoris: Secondary | ICD-10-CM | POA: Diagnosis not present

## 2022-01-13 DIAGNOSIS — R739 Hyperglycemia, unspecified: Secondary | ICD-10-CM | POA: Diagnosis not present

## 2022-01-13 DIAGNOSIS — C7801 Secondary malignant neoplasm of right lung: Secondary | ICD-10-CM | POA: Diagnosis not present

## 2022-01-13 DIAGNOSIS — J449 Chronic obstructive pulmonary disease, unspecified: Secondary | ICD-10-CM | POA: Diagnosis not present

## 2022-01-13 DIAGNOSIS — N390 Urinary tract infection, site not specified: Secondary | ICD-10-CM | POA: Diagnosis not present

## 2022-01-13 DIAGNOSIS — C3411 Malignant neoplasm of upper lobe, right bronchus or lung: Secondary | ICD-10-CM | POA: Diagnosis not present

## 2022-01-13 DIAGNOSIS — N2889 Other specified disorders of kidney and ureter: Secondary | ICD-10-CM | POA: Diagnosis not present

## 2022-01-13 DIAGNOSIS — C7972 Secondary malignant neoplasm of left adrenal gland: Secondary | ICD-10-CM | POA: Diagnosis not present

## 2022-01-13 DIAGNOSIS — D72819 Decreased white blood cell count, unspecified: Secondary | ICD-10-CM | POA: Diagnosis not present

## 2022-01-13 DIAGNOSIS — E039 Hypothyroidism, unspecified: Secondary | ICD-10-CM | POA: Diagnosis not present

## 2022-01-14 DIAGNOSIS — F17210 Nicotine dependence, cigarettes, uncomplicated: Secondary | ICD-10-CM | POA: Diagnosis not present

## 2022-01-14 DIAGNOSIS — J449 Chronic obstructive pulmonary disease, unspecified: Secondary | ICD-10-CM | POA: Diagnosis not present

## 2022-01-14 DIAGNOSIS — R0602 Shortness of breath: Secondary | ICD-10-CM | POA: Diagnosis not present

## 2022-01-14 DIAGNOSIS — C7801 Secondary malignant neoplasm of right lung: Secondary | ICD-10-CM | POA: Diagnosis not present

## 2022-02-22 ENCOUNTER — Other Ambulatory Visit (HOSPITAL_BASED_OUTPATIENT_CLINIC_OR_DEPARTMENT_OTHER): Payer: Self-pay

## 2022-03-11 ENCOUNTER — Other Ambulatory Visit (HOSPITAL_BASED_OUTPATIENT_CLINIC_OR_DEPARTMENT_OTHER): Payer: Self-pay

## 2022-04-03 ENCOUNTER — Emergency Department (HOSPITAL_BASED_OUTPATIENT_CLINIC_OR_DEPARTMENT_OTHER)
Admission: EM | Admit: 2022-04-03 | Discharge: 2022-04-03 | Disposition: A | Discharge status: Home / Self Care | Payer: 59 | Attending: Emergency Medicine | Admitting: Emergency Medicine

## 2022-04-03 ENCOUNTER — Encounter (HOSPITAL_BASED_OUTPATIENT_CLINIC_OR_DEPARTMENT_OTHER): Payer: Self-pay

## 2022-04-03 ENCOUNTER — Other Ambulatory Visit: Payer: Self-pay

## 2022-04-03 DIAGNOSIS — T23232A Burn of second degree of multiple left fingers (nail), not including thumb, initial encounter: Secondary | ICD-10-CM | POA: Diagnosis not present

## 2022-04-03 DIAGNOSIS — X088XXA Exposure to other specified smoke, fire and flames, initial encounter: Secondary | ICD-10-CM | POA: Insufficient documentation

## 2022-04-03 DIAGNOSIS — Y93G2 Activity, grilling and smoking food: Secondary | ICD-10-CM | POA: Diagnosis not present

## 2022-04-03 DIAGNOSIS — Y990 Civilian activity done for income or pay: Secondary | ICD-10-CM | POA: Insufficient documentation

## 2022-04-03 DIAGNOSIS — T310 Burns involving less than 10% of body surface: Secondary | ICD-10-CM | POA: Diagnosis not present

## 2022-04-03 DIAGNOSIS — T23222A Burn of second degree of single left finger (nail) except thumb, initial encounter: Secondary | ICD-10-CM | POA: Insufficient documentation

## 2022-04-03 NOTE — ED Notes (Signed)
Pt verbalized understanding of d/c  instructions and follow up care. Pt A&x4 ambulatory with independent steady gait. ?

## 2022-04-03 NOTE — ED Triage Notes (Addendum)
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

## 2022-04-03 NOTE — ED Provider Notes (Signed)
?[place] EMERGENCY DEPARTMENT ?Provider Note ? ? ?CSN: [id number] ?Arrival date & time: 04/03/22  1637 ? ?  ? ?History ? ?Chief Complaint  ?Patient presents with  ? Hand Burn  ? ? ?Ricky Arellano is a 66 y.o. 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 He took some extra strength tylenol for pain. Wedding ring in place, reports it has been stuck in place for years, patient cannot tell if it is significantly more swollen today.  ? ?HPI ? ?  ? ?Home Medications ?Prior to Admission medications   ?Medication Sig Start Date End Date Taking? Authorizing Provider  ?acetaminophen (TYLENOL) 325 MG tablet Take 650 mg by mouth every 6 (six) hours as needed for moderate pain.    [provider]  ?amoxicillin (AMOXIL) 500 MG capsule Take 1 capsule by mouth 3 times daily until gone ?Patient not taking: No sig reported 05/21/21     ?azithromycin (ZITHROMAX) 250 MG tablet Take 1 tablet (250 mg total) by mouth daily. Take first 2 tablets together, then 1 every day until finished. ?Patient not taking: No sig reported 08/06/18   Robyn Haber, MD  ?chlorpheniramine-HYDROcodone [facility]) 10-8 MG/5ML SUER Take 5 mLs by mouth at bedtime as needed for cough. ?Patient not taking: No sig reported 08/06/18   Robyn Haber, MD  ?clindamycin (CLEOCIN) 150 MG capsule Take 4 capsules (600 mg total) by mouth 1 hour prior to surgery ?Patient not taking: No sig reported 05/18/21     ?clindamycin (CLEOCIN) 150 MG capsule take all 4 capsules by mouth 1 hr prior to surgery ?Patient not taking: No sig reported 05/19/21     ?dutasteride (AVODART) 0.5 MG capsule Take 1 capsule (0.5 mg) by mouth once daily ?Patient taking differently: Take 0.5 mg by mouth every evening. 08/03/21     ?Fluticasone-Umeclidin-Vilant (TRELEGY ELLIPTA) 100-62.5-25  MCG/ACT AEPB INHALE 1 PUFF INTO THE LUNGS DAILY. 09/17/21     ?Fluticasone-Umeclidin-Vilant (TRELEGY ELLIPTA) 100-62.5-25 MCG/INH AEPB Inhale 1 puff into the lungs daily.    [provider]  ?folic acid (FOLVITE) 1 MG tablet TAKE 1 TABLET (1 MG TOTAL) BY MOUTH DAILY. 08/06/21     ?HYDROcodone-acetaminophen (NORCO) 10-325 MG tablet Take 1 tablet by mouth every 6 hours as needed for pain ?Patient not taking: No sig reported 05/21/21     ?HYDROcodone-acetaminophen (NORCO/VICODIN) 5-325 MG tablet Take 1 tablet by mouth every 4 (four) hours as needed. ?Patient not taking: No sig reported 05/29/19   Isla Pence, MD  ?levofloxacin (LEVAQUIN) 500 MG tablet Take 1 tablet on the day before your procedure and the other tablet on the day of your procedure. ?Patient not taking: Reported on 08/13/2021 07/06/21     ?levofloxacin (LEVAQUIN) 500 MG tablet Take 1 tablet (500 mg total) by mouth daily. 08/26/21   Royston Cowper, MD  ?loratadine (CLARITIN) 10 MG tablet Take 10 mg by mouth every evening.    [provider]  ?Propylene Glycol (SYSTANE BALANCE) 0.6 % SOLN Apply 1 drop to eye daily as needed (dry eyes).    [provider]  ?tamsulosin (FLOMAX) 0.4 MG CAPS capsule TAKE 2 CAPSULES BY MOUTH DAILY 10/11/21 10/11/22    ?   ? ?Allergies    ?Patient has no known allergies.   ? ?Review of Systems   ?Review of Systems  ?  Skin:  Positive for wound.  ?All other systems reviewed and are negative. ? ?Physical Exam ?Updated Vital Signs ?BP (!) 154/82 (BP Location: Right Arm)   Pulse 80   Temp 98.8 ?F (37.1 ?C) (Oral)   Resp 17   Ht 5\' 7"  (1.702 m)   Wt 81.6 kg   SpO2 96%   BMI 28.19 kg/m?  ?Physical Exam ?Vitals and nursing note reviewed.  ?Constitutional:   ?   General: He is not in acute distress. ?   Appearance: Normal appearance.  ?HENT:  ?   Head: Normocephalic and atraumatic.  ?Eyes:  ?   General:     ?   Right eye: No discharge.     ?   Left eye: No discharge.  ?Cardiovascular:  ?   Rate and Rhythm:  Normal rate and regular rhythm.  ?   Pulses: Normal pulses.  ?Pulmonary:  ?   Effort: Pulmonary effort is normal. No respiratory distress.  ?Musculoskeletal:     ?   General: No deformity.  ?Skin: ?   General: Skin is warm and dry.  ?   Capillary Refill: Capillary refill takes less than 2 seconds.  ?   Comments: Patient with some subtle skin changes, slight swelling of multiple fingers involving left hand.  He has some blistering, second-degree burns noted of the index, middle, pinky finger on the dorsum.  Did not note any evidence of circumferential burns on any of the digits.  He has no burns to the dorsum of the hand or palm.  ?Neurological:  ?   Mental Status: He is alert and oriented to person, place, and time.  ?Psychiatric:     ?   Mood and Affect: Mood normal.     ?   Behavior: Behavior normal.  ? ? ?ED Results / Procedures / Treatments   ?Labs ?(all labs ordered are listed, but only abnormal results are displayed) ?Labs Reviewed - No data to display ? ?EKG ?None ? ?Radiology ?No results found. ? ?Procedures ?Procedures  ? ? ?Medications Ordered in ED ?Medications - No data to display ? ?ED Course/ Medical Decision Making/ A&P ?  ?                        ?Medical Decision Making ? ?This is an overall well-appearing 66 year old male who presents with concern for burn to multiple fingers of the left hand sustained while working on charcoal grill just prior to arrival.  Patient reports significantly painful.  He has blistering of the index, middle, and pinky finger.  He has a ring in place on his ring finger which appears quite tight, but he reports that his been present for 20 years.  He cannot tell if it is more swollen than normal.  Emergent differential diagnosis includes first-degree burn, secondary burn versus other abnormality, we will make sure to assess for circumferential burns, skin break, etc. ? ?Given tightness of the ring, possible concern for additional swelling, likely first-degree burns that  are subtle involving the ring finger we will remove the ring from patient's hand.  Ring removed without concern. ? ?Multiple second-degree burns with blistering noted on the left hand we will consult with hand surgeon to discuss management.  Spoke with Dr. Doran Durand who agrees to see the patient in clinic, may refer to [facility] burn clinic as they do not specialize in burns, and there is no burn center in the [facility] system.  We provided Vaseline impregnated  gauze dressing for patient's burn and discussed return precautions for signs of infection, worsening pain, swelling, and provide patient with follow-up prior to discharge.  He was discharged in stable condition at this time, return precautions given. ?Final Clinical Impression(s) / ED Diagnoses ?Final diagnoses:  ?Partial thickness burn of multiple fingers of left hand excluding thumb, initial encounter  ? ? ?Rx / DC Orders ?ED Discharge Orders   ? ? None  ? ?  ? ? ?  ?Anselmo Pickler, PA-C ?04/03/22 1836 ? ?  ?Lucrezia Starch, MD ?04/03/22 2115 ? ?

## 2022-04-03 NOTE — Discharge Instructions (Signed)
Please use Tylenol or ibuprofen for pain.  You may use 600 mg ibuprofen every 6 hours or 1000 mg of Tylenol every 6 hours.  You may choose to alternate between the 2.  This would be most effective.  Not to exceed 4 g of Tylenol within 24 hours.  Not to exceed 3200 mg ibuprofen 24 hours. ? ?Call the hand surgeon Dr. Doran Durand first thing in the morning to see if they would like to have you follow-up in their clinic, they may have you follow-up with the burn center as we discussed.  In the meantime keep the bandage that we put on for at least 24 hours, you can rebandage at this point if you are still awaiting follow-up, I recommend that you use vaseline impregnated gauze which you can buy at the pharmacy. ?

## 2022-04-07 ENCOUNTER — Other Ambulatory Visit (HOSPITAL_BASED_OUTPATIENT_CLINIC_OR_DEPARTMENT_OTHER): Payer: Self-pay

## 2022-04-08 ENCOUNTER — Other Ambulatory Visit (HOSPITAL_BASED_OUTPATIENT_CLINIC_OR_DEPARTMENT_OTHER): Payer: Self-pay

## 2022-04-11 DIAGNOSIS — T300 Burn of unspecified body region, unspecified degree: Secondary | ICD-10-CM | POA: Diagnosis not present

## 2022-04-14 DIAGNOSIS — I2510 Atherosclerotic heart disease of native coronary artery without angina pectoris: Secondary | ICD-10-CM | POA: Diagnosis not present

## 2022-04-14 DIAGNOSIS — C3490 Malignant neoplasm of unspecified part of unspecified bronchus or lung: Secondary | ICD-10-CM | POA: Diagnosis not present

## 2022-04-14 DIAGNOSIS — N400 Enlarged prostate without lower urinary tract symptoms: Secondary | ICD-10-CM | POA: Diagnosis not present

## 2022-04-14 DIAGNOSIS — J432 Centrilobular emphysema: Secondary | ICD-10-CM | POA: Diagnosis not present

## 2022-04-14 DIAGNOSIS — C3491 Malignant neoplasm of unspecified part of right bronchus or lung: Secondary | ICD-10-CM | POA: Diagnosis not present

## 2022-04-21 ENCOUNTER — Other Ambulatory Visit (HOSPITAL_BASED_OUTPATIENT_CLINIC_OR_DEPARTMENT_OTHER): Payer: Self-pay

## 2022-05-04 DIAGNOSIS — Z79899 Other long term (current) drug therapy: Secondary | ICD-10-CM | POA: Diagnosis not present

## 2022-05-04 DIAGNOSIS — N138 Other obstructive and reflux uropathy: Secondary | ICD-10-CM | POA: Diagnosis not present

## 2022-05-04 DIAGNOSIS — D72819 Decreased white blood cell count, unspecified: Secondary | ICD-10-CM | POA: Diagnosis not present

## 2022-05-04 DIAGNOSIS — R7303 Prediabetes: Secondary | ICD-10-CM | POA: Diagnosis not present

## 2022-05-04 DIAGNOSIS — D649 Anemia, unspecified: Secondary | ICD-10-CM | POA: Diagnosis not present

## 2022-05-04 DIAGNOSIS — I2510 Atherosclerotic heart disease of native coronary artery without angina pectoris: Secondary | ICD-10-CM | POA: Diagnosis not present

## 2022-05-04 DIAGNOSIS — C3491 Malignant neoplasm of unspecified part of right bronchus or lung: Secondary | ICD-10-CM | POA: Diagnosis not present

## 2022-05-04 DIAGNOSIS — C3411 Malignant neoplasm of upper lobe, right bronchus or lung: Secondary | ICD-10-CM | POA: Diagnosis not present

## 2022-05-04 DIAGNOSIS — C7972 Secondary malignant neoplasm of left adrenal gland: Secondary | ICD-10-CM | POA: Diagnosis not present

## 2022-05-04 DIAGNOSIS — N390 Urinary tract infection, site not specified: Secondary | ICD-10-CM | POA: Diagnosis not present

## 2022-05-04 DIAGNOSIS — N2889 Other specified disorders of kidney and ureter: Secondary | ICD-10-CM | POA: Diagnosis not present

## 2022-05-30 ENCOUNTER — Other Ambulatory Visit (HOSPITAL_BASED_OUTPATIENT_CLINIC_OR_DEPARTMENT_OTHER): Payer: Self-pay

## 2022-05-30 MED ORDER — FOLIC ACID 1 MG PO TABS
1.0000 mg | ORAL_TABLET | Freq: Every day | ORAL | 6 refills | Status: DC
  Filled 2022-05-30: qty 30, 30d supply, fill #0
  Filled 2022-07-12: qty 30, 30d supply, fill #1
  Filled 2022-08-15: qty 30, 30d supply, fill #2
  Filled 2022-10-04: qty 30, 30d supply, fill #3
  Filled 2022-11-10: qty 30, 30d supply, fill #4
  Filled 2022-12-09: qty 30, 30d supply, fill #5
  Filled 2023-01-03: qty 30, 30d supply, fill #6

## 2022-07-12 ENCOUNTER — Other Ambulatory Visit (HOSPITAL_BASED_OUTPATIENT_CLINIC_OR_DEPARTMENT_OTHER): Payer: Self-pay

## 2022-07-12 MED ORDER — TRELEGY ELLIPTA 100-62.5-25 MCG/ACT IN AEPB
1.0000 | INHALATION_SPRAY | Freq: Every day | RESPIRATORY_TRACT | 1 refills | Status: DC
  Filled 2022-07-12: qty 60, 30d supply, fill #0
  Filled 2022-09-12: qty 60, 30d supply, fill #1

## 2022-07-14 ENCOUNTER — Other Ambulatory Visit (HOSPITAL_BASED_OUTPATIENT_CLINIC_OR_DEPARTMENT_OTHER): Payer: Self-pay

## 2022-07-14 DIAGNOSIS — C7801 Secondary malignant neoplasm of right lung: Secondary | ICD-10-CM | POA: Diagnosis not present

## 2022-07-14 DIAGNOSIS — F17210 Nicotine dependence, cigarettes, uncomplicated: Secondary | ICD-10-CM | POA: Diagnosis not present

## 2022-07-14 DIAGNOSIS — R0602 Shortness of breath: Secondary | ICD-10-CM | POA: Diagnosis not present

## 2022-07-14 DIAGNOSIS — J449 Chronic obstructive pulmonary disease, unspecified: Secondary | ICD-10-CM | POA: Diagnosis not present

## 2022-07-14 MED ORDER — TRELEGY ELLIPTA 100-62.5-25 MCG/ACT IN AEPB
1.0000 | INHALATION_SPRAY | Freq: Every day | RESPIRATORY_TRACT | 11 refills | Status: AC
  Filled 2022-10-31: qty 60, 30d supply, fill #0
  Filled 2023-02-23 – 2023-02-24 (×2): qty 60, 30d supply, fill #1

## 2022-08-09 DIAGNOSIS — I700 Atherosclerosis of aorta: Secondary | ICD-10-CM | POA: Diagnosis not present

## 2022-08-09 DIAGNOSIS — C3411 Malignant neoplasm of upper lobe, right bronchus or lung: Secondary | ICD-10-CM | POA: Diagnosis not present

## 2022-08-09 DIAGNOSIS — J439 Emphysema, unspecified: Secondary | ICD-10-CM | POA: Diagnosis not present

## 2022-08-09 DIAGNOSIS — C3491 Malignant neoplasm of unspecified part of right bronchus or lung: Secondary | ICD-10-CM | POA: Diagnosis not present

## 2022-08-09 DIAGNOSIS — N400 Enlarged prostate without lower urinary tract symptoms: Secondary | ICD-10-CM | POA: Diagnosis not present

## 2022-08-09 DIAGNOSIS — J432 Centrilobular emphysema: Secondary | ICD-10-CM | POA: Diagnosis not present

## 2022-08-09 DIAGNOSIS — C7971 Secondary malignant neoplasm of right adrenal gland: Secondary | ICD-10-CM | POA: Diagnosis not present

## 2022-08-15 ENCOUNTER — Other Ambulatory Visit (HOSPITAL_BASED_OUTPATIENT_CLINIC_OR_DEPARTMENT_OTHER): Payer: Self-pay

## 2022-08-15 DIAGNOSIS — Z9221 Personal history of antineoplastic chemotherapy: Secondary | ICD-10-CM | POA: Diagnosis not present

## 2022-08-15 DIAGNOSIS — C3491 Malignant neoplasm of unspecified part of right bronchus or lung: Secondary | ICD-10-CM | POA: Diagnosis not present

## 2022-08-15 DIAGNOSIS — E119 Type 2 diabetes mellitus without complications: Secondary | ICD-10-CM | POA: Diagnosis not present

## 2022-08-15 DIAGNOSIS — C7972 Secondary malignant neoplasm of left adrenal gland: Secondary | ICD-10-CM | POA: Diagnosis not present

## 2022-08-15 DIAGNOSIS — C3411 Malignant neoplasm of upper lobe, right bronchus or lung: Secondary | ICD-10-CM | POA: Diagnosis not present

## 2022-08-15 DIAGNOSIS — Z7189 Other specified counseling: Secondary | ICD-10-CM | POA: Diagnosis not present

## 2022-08-15 DIAGNOSIS — R911 Solitary pulmonary nodule: Secondary | ICD-10-CM | POA: Diagnosis not present

## 2022-08-15 DIAGNOSIS — C7801 Secondary malignant neoplasm of right lung: Secondary | ICD-10-CM | POA: Diagnosis not present

## 2022-08-15 MED ORDER — DUTASTERIDE 0.5 MG PO CAPS
ORAL_CAPSULE | ORAL | 3 refills | Status: AC
  Filled 2022-08-15: qty 90, 90d supply, fill #0
  Filled 2022-12-09: qty 90, 90d supply, fill #1
  Filled 2023-04-06: qty 90, 90d supply, fill #2
  Filled 2023-08-01: qty 90, 90d supply, fill #3

## 2022-08-16 ENCOUNTER — Other Ambulatory Visit (HOSPITAL_BASED_OUTPATIENT_CLINIC_OR_DEPARTMENT_OTHER): Payer: Self-pay

## 2022-08-17 ENCOUNTER — Other Ambulatory Visit (HOSPITAL_BASED_OUTPATIENT_CLINIC_OR_DEPARTMENT_OTHER): Payer: Self-pay

## 2022-09-12 ENCOUNTER — Other Ambulatory Visit (HOSPITAL_BASED_OUTPATIENT_CLINIC_OR_DEPARTMENT_OTHER): Payer: Self-pay

## 2022-09-26 DIAGNOSIS — E032 Hypothyroidism due to medicaments and other exogenous substances: Secondary | ICD-10-CM | POA: Diagnosis not present

## 2022-10-04 ENCOUNTER — Other Ambulatory Visit (HOSPITAL_BASED_OUTPATIENT_CLINIC_OR_DEPARTMENT_OTHER): Payer: Self-pay

## 2022-10-31 ENCOUNTER — Other Ambulatory Visit (HOSPITAL_BASED_OUTPATIENT_CLINIC_OR_DEPARTMENT_OTHER): Payer: Self-pay

## 2022-11-01 ENCOUNTER — Other Ambulatory Visit (HOSPITAL_BASED_OUTPATIENT_CLINIC_OR_DEPARTMENT_OTHER): Payer: Self-pay

## 2022-11-01 MED ORDER — COMIRNATY 30 MCG/0.3ML IM SUSY
PREFILLED_SYRINGE | INTRAMUSCULAR | 0 refills | Status: AC
  Filled 2022-11-01: qty 0.3, 1d supply, fill #0

## 2022-11-01 MED ORDER — FLUAD QUADRIVALENT 0.5 ML IM PRSY
PREFILLED_SYRINGE | INTRAMUSCULAR | 0 refills | Status: AC
  Filled 2022-11-01: qty 0.5, 1d supply, fill #0

## 2022-11-10 ENCOUNTER — Other Ambulatory Visit (HOSPITAL_BASED_OUTPATIENT_CLINIC_OR_DEPARTMENT_OTHER): Payer: Self-pay

## 2022-11-11 ENCOUNTER — Other Ambulatory Visit (HOSPITAL_BASED_OUTPATIENT_CLINIC_OR_DEPARTMENT_OTHER): Payer: Self-pay

## 2022-11-11 MED ORDER — TAMSULOSIN HCL 0.4 MG PO CAPS
0.8000 mg | ORAL_CAPSULE | Freq: Every day | ORAL | 11 refills | Status: DC
  Filled 2022-11-11: qty 60, 30d supply, fill #0
  Filled 2022-12-09: qty 60, 30d supply, fill #1
  Filled 2023-01-03: qty 60, 30d supply, fill #2
  Filled 2023-02-23: qty 60, 30d supply, fill #3
  Filled 2023-04-06: qty 60, 30d supply, fill #4
  Filled 2023-05-08: qty 60, 30d supply, fill #5
  Filled 2023-06-21: qty 60, 30d supply, fill #6
  Filled 2023-08-01: qty 60, 30d supply, fill #7
  Filled 2023-09-03: qty 60, 30d supply, fill #8
  Filled 2023-10-02: qty 60, 30d supply, fill #9

## 2022-12-09 ENCOUNTER — Other Ambulatory Visit (HOSPITAL_BASED_OUTPATIENT_CLINIC_OR_DEPARTMENT_OTHER): Payer: Self-pay

## 2022-12-09 MED ORDER — TRELEGY ELLIPTA 100-62.5-25 MCG/ACT IN AEPB
1.0000 | INHALATION_SPRAY | Freq: Every day | RESPIRATORY_TRACT | 1 refills | Status: DC
  Filled 2022-12-09: qty 60, 30d supply, fill #0
  Filled 2023-01-03: qty 60, 30d supply, fill #1

## 2022-12-19 DIAGNOSIS — C3490 Malignant neoplasm of unspecified part of unspecified bronchus or lung: Secondary | ICD-10-CM | POA: Diagnosis not present

## 2022-12-19 DIAGNOSIS — E279 Disorder of adrenal gland, unspecified: Secondary | ICD-10-CM | POA: Diagnosis not present

## 2022-12-19 DIAGNOSIS — C3411 Malignant neoplasm of upper lobe, right bronchus or lung: Secondary | ICD-10-CM | POA: Diagnosis not present

## 2022-12-19 DIAGNOSIS — J432 Centrilobular emphysema: Secondary | ICD-10-CM | POA: Diagnosis not present

## 2022-12-19 DIAGNOSIS — N329 Bladder disorder, unspecified: Secondary | ICD-10-CM | POA: Diagnosis not present

## 2022-12-19 DIAGNOSIS — R590 Localized enlarged lymph nodes: Secondary | ICD-10-CM | POA: Diagnosis not present

## 2022-12-19 DIAGNOSIS — I700 Atherosclerosis of aorta: Secondary | ICD-10-CM | POA: Diagnosis not present

## 2022-12-19 DIAGNOSIS — J439 Emphysema, unspecified: Secondary | ICD-10-CM | POA: Diagnosis not present

## 2022-12-19 DIAGNOSIS — Q8909 Congenital malformations of spleen: Secondary | ICD-10-CM | POA: Diagnosis not present

## 2022-12-19 DIAGNOSIS — N400 Enlarged prostate without lower urinary tract symptoms: Secondary | ICD-10-CM | POA: Diagnosis not present

## 2022-12-19 DIAGNOSIS — N2882 Megaloureter: Secondary | ICD-10-CM | POA: Diagnosis not present

## 2022-12-19 DIAGNOSIS — J479 Bronchiectasis, uncomplicated: Secondary | ICD-10-CM | POA: Diagnosis not present

## 2022-12-19 DIAGNOSIS — J8410 Pulmonary fibrosis, unspecified: Secondary | ICD-10-CM | POA: Diagnosis not present

## 2022-12-19 DIAGNOSIS — N281 Cyst of kidney, acquired: Secondary | ICD-10-CM | POA: Diagnosis not present

## 2022-12-21 DIAGNOSIS — I2510 Atherosclerotic heart disease of native coronary artery without angina pectoris: Secondary | ICD-10-CM | POA: Diagnosis not present

## 2022-12-21 DIAGNOSIS — D72819 Decreased white blood cell count, unspecified: Secondary | ICD-10-CM | POA: Diagnosis not present

## 2022-12-21 DIAGNOSIS — E119 Type 2 diabetes mellitus without complications: Secondary | ICD-10-CM | POA: Diagnosis not present

## 2022-12-21 DIAGNOSIS — D649 Anemia, unspecified: Secondary | ICD-10-CM | POA: Diagnosis not present

## 2022-12-21 DIAGNOSIS — C7972 Secondary malignant neoplasm of left adrenal gland: Secondary | ICD-10-CM | POA: Diagnosis not present

## 2022-12-21 DIAGNOSIS — N390 Urinary tract infection, site not specified: Secondary | ICD-10-CM | POA: Diagnosis not present

## 2022-12-21 DIAGNOSIS — C3491 Malignant neoplasm of unspecified part of right bronchus or lung: Secondary | ICD-10-CM | POA: Diagnosis not present

## 2022-12-21 DIAGNOSIS — N2889 Other specified disorders of kidney and ureter: Secondary | ICD-10-CM | POA: Diagnosis not present

## 2022-12-21 DIAGNOSIS — F17200 Nicotine dependence, unspecified, uncomplicated: Secondary | ICD-10-CM | POA: Diagnosis not present

## 2022-12-21 DIAGNOSIS — N138 Other obstructive and reflux uropathy: Secondary | ICD-10-CM | POA: Diagnosis not present

## 2022-12-21 DIAGNOSIS — N401 Enlarged prostate with lower urinary tract symptoms: Secondary | ICD-10-CM | POA: Diagnosis not present

## 2023-01-03 ENCOUNTER — Other Ambulatory Visit (HOSPITAL_BASED_OUTPATIENT_CLINIC_OR_DEPARTMENT_OTHER): Payer: Self-pay

## 2023-01-05 ENCOUNTER — Other Ambulatory Visit (HOSPITAL_COMMUNITY): Payer: Self-pay

## 2023-02-23 ENCOUNTER — Other Ambulatory Visit (HOSPITAL_BASED_OUTPATIENT_CLINIC_OR_DEPARTMENT_OTHER): Payer: Self-pay

## 2023-02-23 MED ORDER — FOLIC ACID 1 MG PO TABS
1.0000 mg | ORAL_TABLET | Freq: Every day | ORAL | 6 refills | Status: DC
  Filled 2023-02-23: qty 30, 30d supply, fill #0
  Filled 2023-04-06: qty 30, 30d supply, fill #1
  Filled 2023-05-08: qty 30, 30d supply, fill #2
  Filled 2023-06-21: qty 30, 30d supply, fill #3
  Filled 2023-08-01: qty 30, 30d supply, fill #4
  Filled 2023-09-03: qty 30, 30d supply, fill #5
  Filled 2023-10-02: qty 30, 30d supply, fill #6

## 2023-02-24 ENCOUNTER — Other Ambulatory Visit (HOSPITAL_BASED_OUTPATIENT_CLINIC_OR_DEPARTMENT_OTHER): Payer: Self-pay

## 2023-03-04 ENCOUNTER — Emergency Department (HOSPITAL_BASED_OUTPATIENT_CLINIC_OR_DEPARTMENT_OTHER)
Admission: EM | Admit: 2023-03-04 | Discharge: 2023-03-04 | Disposition: A | Discharge status: Other Acute Inpatient Hospital | Payer: Medicare HMO | Attending: Emergency Medicine | Admitting: Emergency Medicine

## 2023-03-04 ENCOUNTER — Emergency Department (HOSPITAL_BASED_OUTPATIENT_CLINIC_OR_DEPARTMENT_OTHER): Payer: Medicare HMO

## 2023-03-04 ENCOUNTER — Encounter (HOSPITAL_BASED_OUTPATIENT_CLINIC_OR_DEPARTMENT_OTHER): Payer: Self-pay | Admitting: Emergency Medicine

## 2023-03-04 ENCOUNTER — Other Ambulatory Visit: Payer: Self-pay

## 2023-03-04 DIAGNOSIS — R0902 Hypoxemia: Secondary | ICD-10-CM | POA: Diagnosis not present

## 2023-03-04 DIAGNOSIS — Z20822 Contact with and (suspected) exposure to covid-19: Secondary | ICD-10-CM | POA: Insufficient documentation

## 2023-03-04 DIAGNOSIS — R0602 Shortness of breath: Secondary | ICD-10-CM | POA: Diagnosis not present

## 2023-03-04 DIAGNOSIS — E119 Type 2 diabetes mellitus without complications: Secondary | ICD-10-CM | POA: Diagnosis not present

## 2023-03-04 DIAGNOSIS — R42 Dizziness and giddiness: Secondary | ICD-10-CM | POA: Diagnosis not present

## 2023-03-04 DIAGNOSIS — J449 Chronic obstructive pulmonary disease, unspecified: Secondary | ICD-10-CM | POA: Insufficient documentation

## 2023-03-04 DIAGNOSIS — Z85118 Personal history of other malignant neoplasm of bronchus and lung: Secondary | ICD-10-CM | POA: Insufficient documentation

## 2023-03-04 DIAGNOSIS — Z7951 Long term (current) use of inhaled steroids: Secondary | ICD-10-CM | POA: Diagnosis not present

## 2023-03-04 DIAGNOSIS — Z79899 Other long term (current) drug therapy: Secondary | ICD-10-CM | POA: Insufficient documentation

## 2023-03-04 DIAGNOSIS — E039 Hypothyroidism, unspecified: Secondary | ICD-10-CM | POA: Diagnosis not present

## 2023-03-04 LAB — CBC
HCT: 44.2 % (ref 39.0–52.0)
Hemoglobin: 13.9 g/dL (ref 13.0–17.0)
MCH: 25.6 pg — ABNORMAL LOW (ref 26.0–34.0)
MCHC: 31.4 g/dL (ref 30.0–36.0)
MCV: 81.5 fL (ref 80.0–100.0)
Platelets: 227 10*3/uL (ref 150–400)
RBC: 5.42 MIL/uL (ref 4.22–5.81)
RDW: 16 % — ABNORMAL HIGH (ref 11.5–15.5)
WBC: 7.6 10*3/uL (ref 4.0–10.5)
nRBC: 0 % (ref 0.0–0.2)

## 2023-03-04 LAB — COMPREHENSIVE METABOLIC PANEL
ALT: 14 U/L (ref 0–44)
AST: 21 U/L (ref 15–41)
Albumin: 3.9 g/dL (ref 3.5–5.0)
Alkaline Phosphatase: 79 U/L (ref 38–126)
Anion gap: 10 (ref 5–15)
BUN: 16 mg/dL (ref 8–23)
CO2: 23 mmol/L (ref 22–32)
Calcium: 8.8 mg/dL — ABNORMAL LOW (ref 8.9–10.3)
Chloride: 104 mmol/L (ref 98–111)
Creatinine, Ser: 1.48 mg/dL — ABNORMAL HIGH (ref 0.61–1.24)
GFR, Estimated: 52 mL/min — ABNORMAL LOW (ref >60)
Glucose, Bld: 152 mg/dL — ABNORMAL HIGH (ref 70–99)
Potassium: 3.6 mmol/L (ref 3.5–5.1)
Sodium: 137 mmol/L (ref 135–145)
Total Bilirubin: 0.3 mg/dL (ref 0.3–1.2)
Total Protein: 7.7 g/dL (ref 6.5–8.1)

## 2023-03-04 LAB — PROTIME-INR
INR: 1.1 (ref 0.8–1.2)
Prothrombin Time: 14 seconds (ref 11.4–15.2)

## 2023-03-04 LAB — DIFFERENTIAL
Abs Immature Granulocytes: 0.02 10*3/uL (ref 0.00–0.07)
Basophils Absolute: 0 10*3/uL (ref 0.0–0.1)
Basophils Relative: 0 %
Eosinophils Absolute: 0 10*3/uL (ref 0.0–0.5)
Eosinophils Relative: 0 %
Immature Granulocytes: 0 %
Lymphocytes Relative: 11 %
Lymphs Abs: 0.8 10*3/uL (ref 0.7–4.0)
Monocytes Absolute: 0.2 10*3/uL (ref 0.1–1.0)
Monocytes Relative: 3 %
Neutro Abs: 6.5 10*3/uL (ref 1.7–7.7)
Neutrophils Relative %: 86 %

## 2023-03-04 LAB — TROPONIN I (HIGH SENSITIVITY)
Troponin I (High Sensitivity): 6 ng/L (ref <18)
Troponin I (High Sensitivity): 8 ng/L (ref <18)

## 2023-03-04 LAB — BRAIN NATRIURETIC PEPTIDE: B Natriuretic Peptide: 29.5 pg/mL (ref 0.0–100.0)

## 2023-03-04 LAB — RESP PANEL BY RT-PCR (RSV, FLU A&B, COVID)  RVPGX2
Influenza A by PCR: NEGATIVE
Influenza B by PCR: NEGATIVE
Resp Syncytial Virus by PCR: NEGATIVE
SARS Coronavirus 2 by RT PCR: NEGATIVE

## 2023-03-04 LAB — ETHANOL: Alcohol, Ethyl (B): <10 mg/dL (ref <10)

## 2023-03-04 LAB — APTT: aPTT: 29 seconds (ref 24–36)

## 2023-03-04 MED ORDER — IOHEXOL 350 MG/ML SOLN
100.0000 mL | Freq: Once | INTRAVENOUS | Status: AC | PRN
  Administered 2023-03-04: 80 mL via INTRAVENOUS

## 2023-03-04 MED ORDER — ONDANSETRON HCL 4 MG/2ML IJ SOLN
4.0000 mg | Freq: Once | INTRAMUSCULAR | Status: AC
  Administered 2023-03-04: 4 mg via INTRAVENOUS
  Filled 2023-03-04: qty 2

## 2023-03-04 MED ORDER — ALBUTEROL SULFATE HFA 108 (90 BASE) MCG/ACT IN AERS
2.0000 | INHALATION_SPRAY | Freq: Once | RESPIRATORY_TRACT | Status: AC
  Administered 2023-03-04: 2 via RESPIRATORY_TRACT
  Filled 2023-03-04: qty 6.7

## 2023-03-04 MED ORDER — SODIUM CHLORIDE 0.9 % IV SOLN
2.0000 g | Freq: Once | INTRAVENOUS | Status: AC
  Administered 2023-03-04: 2 g via INTRAVENOUS
  Filled 2023-03-04: qty 20

## 2023-03-04 MED ORDER — SODIUM CHLORIDE 0.9% FLUSH
3.0000 mL | Freq: Once | INTRAVENOUS | Status: DC
  Filled 2023-03-04: qty 3

## 2023-03-04 MED ORDER — SODIUM CHLORIDE 0.9 % IV SOLN: INTRAVENOUS | Status: DC | PRN

## 2023-03-04 MED ORDER — MECLIZINE HCL 25 MG PO TABS
50.0000 mg | ORAL_TABLET | Freq: Once | ORAL | Status: AC
  Administered 2023-03-04: 50 mg via ORAL
  Filled 2023-03-04: qty 2

## 2023-03-04 MED ORDER — GADOBUTROL 1 MMOL/ML IV SOLN
7.5000 mL | Freq: Once | INTRAVENOUS | Status: AC | PRN
  Administered 2023-03-04: 7.5 mL via INTRAVENOUS

## 2023-03-04 MED ORDER — SODIUM CHLORIDE 0.9 % IV SOLN
500.0000 mg | Freq: Once | INTRAVENOUS | Status: AC
  Administered 2023-03-04: 500 mg via INTRAVENOUS
  Filled 2023-03-04: qty 5

## 2023-03-04 NOTE — ED Notes (Signed)
SpO2 88-90% on room air, patient denies feeling SOB but nauseated. RN aware.

## 2023-03-04 NOTE — ED Notes (Signed)
Patient given snack and some water per.

## 2023-03-04 NOTE — ED Triage Notes (Signed)
Patient c/o dizziness x 1 week. Patient started vomiting yesterday.

## 2023-03-04 NOTE — ED Notes (Signed)
Patient transported to CT 

## 2023-03-04 NOTE — ED Notes (Signed)
Pt transported to MRI 

## 2023-03-04 NOTE — ED Notes (Signed)
Pt in MRI. Unable to get trop at this time.

## 2023-03-04 NOTE — ED Provider Notes (Signed)
[place] [facility]GENCY DEPARTMENT AT [facility] Provider Note   CSN: [id number] Arrival date & time: 03/04/23  1102     History  Chief Complaint  Patient presents with   Dizziness    Ricky Arellano is a 67 y.o. male.  HPI     66 year old male with history of stage IV lung cancer, COPD, diabetes, hypothyroidism enlarged prostate who presents with concern for dizziness.  Reports 1 week ago began to have dizziness just with position changes, like going from sitting to standing, standing to laying down that would last about 30 seconds and then improved.  Yesterday, he began to have the same sensation of room spinning and dizziness however it remained constant.  It is somewhat worsened by position changes but is constant.  He is having difficulty ambulating due to his dizziness.  Has associated nausea and vomiting.  Denies other numbness, weakness, difficulty talking, facial droop, change in vision.  Denies headache.  Denies chest pain, shortness of breath, cough, fever.  Oxygen levels noted to go down to 86% on room air.  While he has a history of COPD and lung cancer, he does not have known hypoxia or and has not been prescribed home oxygen.  Past Medical History:  Diagnosis Date   Arthritis    Cancer    COPD (chronic obstructive pulmonary disease)    Elevated PSA    Emphysema lung    GERD (gastroesophageal reflux disease)    occ no meds   History of chemotherapy    Lung cancer 2019   Stage 4 with metastisis to adrenal gland     Home Medications Prior to Admission medications   Medication Sig Start Date End Date Taking? Authorizing Provider  acetaminophen (TYLENOL) 325 MG tablet Take 650 mg by mouth every 6 (six) hours as needed for moderate pain.    [provider]  amoxicillin (AMOXIL) 500 MG capsule Take 1 capsule by mouth 3 times daily until gone Patient not taking: No sig reported 05/21/21     azithromycin (ZITHROMAX) 250 MG tablet Take 1 tablet (250  mg total) by mouth daily. Take first 2 tablets together, then 1 every day until finished. Patient not taking: No sig reported 08/06/18   Elvina Sidle, MD  chlorpheniramine-HYDROcodone [facility]) 10-8 MG/5ML SUER Take 5 mLs by mouth at bedtime as needed for cough. Patient not taking: No sig reported 08/06/18   Elvina Sidle, MD  clindamycin (CLEOCIN) 150 MG capsule Take 4 capsules (600 mg total) by mouth 1 hour prior to surgery Patient not taking: No sig reported 05/18/21     clindamycin (CLEOCIN) 150 MG capsule take all 4 capsules by mouth 1 hr prior to surgery Patient not taking: No sig reported 05/19/21     COVID-19 mRNA vaccine 2023-2024 (COMIRNATY) syringe Inject into the muscle. 11/01/22   Judyann Munson, MD  dutasteride (AVODART) 0.5 MG capsule Take 1 capsule (0.5 mg) by mouth once daily 08/15/22     Fluticasone-Umeclidin-Vilant (TRELEGY ELLIPTA) 100-62.5-25 MCG/ACT AEPB Inhale 1 puff by mouth into the lungs daily. 07/14/22     Fluticasone-Umeclidin-Vilant (TRELEGY ELLIPTA) 100-62.5-25 MCG/ACT AEPB Inhale 1 puff into the lungs daily. 12/09/22     Fluticasone-Umeclidin-Vilant (TRELEGY ELLIPTA) 100-62.5-25 MCG/INH AEPB Inhale 1 puff into the lungs daily.    [provider]  folic acid (FOLVITE) 1 MG tablet Take 1 tablet (1 mg total) by mouth daily. 02/23/23     HYDROcodone-acetaminophen (NORCO) 10-325 MG tablet Take 1 tablet by mouth  every 6 hours as needed for pain Patient not taking: No sig reported 05/21/21     HYDROcodone-acetaminophen (NORCO/VICODIN) 5-325 MG tablet Take 1 tablet by mouth every 4 (four) hours as needed. Patient not taking: No sig reported 05/29/19   Jacalyn Lefevre, MD  influenza vaccine adjuvanted (FLUAD QUADRIVALENT) 0.5 ML injection Inject into the muscle. 11/01/22   Judyann Munson, MD  levofloxacin (LEVAQUIN) 500 MG tablet Take 1 tablet on the day before your procedure and the other tablet on the day of your procedure. Patient not taking:  Reported on 08/13/2021 07/06/21     levofloxacin (LEVAQUIN) 500 MG tablet Take 1 tablet (500 mg total) by mouth daily. 08/26/21   Orson Ape, MD  loratadine (CLARITIN) 10 MG tablet Take 10 mg by mouth every evening.    [provider]  Propylene Glycol (SYSTANE BALANCE) 0.6 % SOLN Apply 1 drop to eye daily as needed (dry eyes).    [provider]  tamsulosin (FLOMAX) 0.4 MG CAPS capsule Take 2 capsules (0.8 mg total) by mouth daily. 11/11/22         Allergies    Patient has no known allergies.    Review of Systems   Review of Systems  Physical Exam Updated Vital Signs BP (!) 155/88   Pulse 82   Temp 97.7 F (36.5 C) (Oral)   Resp 14   Ht  (1.702 m)   Wt 87.5 kg   SpO2 91%   BMI 30.23 kg/m  Physical Exam Vitals and nursing note reviewed.  Constitutional:      General: He is not in acute distress.    Appearance: He is well-developed. He is not diaphoretic.  HENT:     Head: Normocephalic and atraumatic.     Ears:     Comments: Cerumen right ear, visualized TM normal Left ear normal TM, small hair in canal not near to tm Eyes:     General: No visual field deficit.    Conjunctiva/sclera: Conjunctivae normal.  Cardiovascular:     Rate and Rhythm: Normal rate and regular rhythm.     Heart sounds: Normal heart sounds. No murmur heard.    No friction rub. No gallop.  Pulmonary:     Effort: Pulmonary effort is normal. No respiratory distress.     Breath sounds: Normal breath sounds. No wheezing or rales.  Abdominal:     General: There is no distension.     Palpations: Abdomen is soft.     Tenderness: There is no abdominal tenderness. There is no guarding.  Musculoskeletal:     Cervical back: Normal range of motion.  Skin:    General: Skin is warm and dry.  Neurological:     Mental Status: He is alert and oriented to person, place, and time.     GCS: GCS eye subscore is 4. GCS verbal subscore is 5. GCS motor subscore is 6.     Cranial Nerves: No  cranial nerve deficit, dysarthria or facial asymmetry.     Sensory: Sensation is intact. No sensory deficit.     Motor: No weakness.     Coordination: Coordination is intact. Finger-Nose-Finger Test and Heel to [place] Test normal.     Comments: Nystagmus when looking to right, rotational component     ED Results / Procedures / Treatments   Labs (all labs ordered are listed, but only abnormal results are displayed) Labs Reviewed  CBC - Abnormal; Notable for the following components:  Result Value   MCH 25.6 (*)    RDW 16.0 (*)    All other components within normal limits  COMPREHENSIVE METABOLIC PANEL - Abnormal; Notable for the following components:   Glucose, Bld 152 (*)    Creatinine, Ser 1.48 (*)    Calcium 8.8 (*)    GFR, Estimated 52 (*)    All other components within normal limits  RESP PANEL BY RT-PCR (RSV, FLU A&B, COVID)  RVPGX2  PROTIME-INR  APTT  DIFFERENTIAL  ETHANOL  BRAIN NATRIURETIC PEPTIDE  TROPONIN I (HIGH SENSITIVITY)  TROPONIN I (HIGH SENSITIVITY)    EKG EKG Interpretation  Date/Time:  Saturday March 04 2023 11:17:03 EDT Ventricular Rate:  94 PR Interval:  190 QRS Duration: 101 QT Interval:  375 QTC Calculation: 469 R Axis:   103 Text Interpretation: Sinus rhythm Left posterior fascicular block Abnormal R-wave progression, late transition Borderline T wave abnormalities Baseline wander in lead(s) V3 No significant change since last tracing Confirmed by Alvira Monday ([id number]) on 03/04/2023 1:06:16 PM  Radiology CT HEAD WO CONTRAST  Result Date: 03/04/2023 CLINICAL DATA:  Dizziness for 1 week.  Vomiting yesterday. EXAM: CT HEAD WITHOUT CONTRAST TECHNIQUE: Contiguous axial images were obtained from the base of the skull through the vertex without intravenous contrast. RADIATION DOSE REDUCTION: This exam was performed according to the departmental dose-optimization program which includes automated exposure control, adjustment of the mA and/or kV  according to patient size and/or use of iterative reconstruction technique. COMPARISON:  Brain MRI 11/26/2021 FINDINGS: Brain: No evidence of acute infarction (no convincing cerebellar infarct when accounting for streak artifact), hemorrhage, hydrocephalus, extra-axial collection or mass lesion/mass effect. Vascular: No hyperdense vessel or unexpected calcification. Skull: Normal. Negative for fracture or focal lesion. Sinuses/Orbits: Mild mucosal thickening symmetrically in the paranasal sinuses. IMPRESSION: No acute finding or explanation for symptoms. Electronically Signed   By: Tiburcio Pea M.D.   On: 03/04/2023 11:32    Procedures Procedures    Medications Ordered in ED Medications  sodium chloride flush (NS) 0.9 % injection 3 mL (0 mLs Intravenous Hold 03/04/23 1212)  ondansetron (ZOFRAN) injection 4 mg (4 mg Intravenous Given 03/04/23 1236)  iohexol (OMNIPAQUE) 350 MG/ML injection 100 mL (80 mLs Intravenous Contrast Given 03/04/23 1327)  meclizine (ANTIVERT) tablet 50 mg (50 mg Oral Given 03/04/23 1307)    ED Course/ Medical Decision Making/ A&P                               67 year old male with history of stage IV lung cancer, COPD, diabetes, hypothyroidism enlarged prostate who presents with concern for dizziness.  Differential diagnosis includes peripheral vertigo, etiologies of central vertigo such as metastases or CVA, as well as systemic causes of dizziness such as PE, anemia, electrolyte abnormalities, cardiac arrhythmia. EKG completed and evaluated by me shows a normal sinus rhythm without acute changes.  Labs completed and personally about interpreted by me show no anemia, no leukocytosis, no clinically significant electrolyte abnormalities, negative alcohol level, normal INR.  CT head completed shows no acute abnormalities. Concern for some rotary nystagmus on exam, given constant vertigo, plan on ordering MRI with and without to evaluate for central etiology including  metastases.  He was incidentally found to be hypoxic down to 86% on room air.  Will order CT PE study given his cancer history and hypoxia.  Given this degree of hypoxia, feel he will need admission. CT completed shows no PE, shows  low inspiratory volumes with diffuse mild interstitial prominence, severe emphysema.  Clinically I do not feel that his symptoms are consistent with atypical or viral respiratory infection, and feel his hypoxia is likely chronic secondary to his COPD/emphysema and he likely requires home oxygen for hypoxia.  COVID/flu testing pending.   Discussed with [facility] Dr. Clelia Croft possible admission given oxygen requirement, who recommends returning a call for admission when COVID/flu and MRI are completed.          Final Clinical Impression(s) / ED Diagnoses Final diagnoses:  Vertigo  Hypoxia    Rx / DC Orders ED Discharge Orders     None         Alvira Monday, MD 03/04/23 [phone]

## 2023-03-04 NOTE — ED Provider Notes (Addendum)
MRI is normal of the brain.  Overall he has new hypoxia.  This could be a COPD exacerbation or viral/infectious process.  CT scan showed atypical pneumonia possibly.  He continues to have low oxygen when we trial him off O2.  He does have COPD history.  Has been given IV antibiotics and breathing treatment.  Admitted to [facility] with Dr. Charolette Forward  Patient hemodynamic stable at time of transfer to [facility]  This chart was dictated using voice recognition software.  Despite best efforts to proofread,  errors can occur which can change the documentation meaning.    Virgina Norfolk, DO 03/04/23 1734    Virgina Norfolk, DO 03/04/23 1747    Virgina Norfolk, DO 03/04/23 2217

## 2023-03-04 NOTE — ED Notes (Signed)
Called PAL's line at [facility] for possible transfer

## 2023-03-07 ENCOUNTER — Other Ambulatory Visit (HOSPITAL_BASED_OUTPATIENT_CLINIC_OR_DEPARTMENT_OTHER): Payer: Self-pay

## 2023-03-07 MED ORDER — ACETAMINOPHEN 325 MG PO TABS
650.0000 mg | ORAL_TABLET | Freq: Four times a day (QID) | ORAL | 0 refills | Status: AC | PRN
  Filled 2023-03-07: qty 100, 13d supply, fill #0

## 2023-03-07 MED ORDER — MECLIZINE HCL 25 MG PO TABS
25.0000 mg | ORAL_TABLET | Freq: Two times a day (BID) | ORAL | 0 refills | Status: AC
  Filled 2023-03-07: qty 30, 15d supply, fill #0

## 2023-03-07 MED ORDER — BENZONATATE 200 MG PO CAPS
200.0000 mg | ORAL_CAPSULE | Freq: Three times a day (TID) | ORAL | 0 refills | Status: AC | PRN
  Filled 2023-03-07: qty 20, 7d supply, fill #0

## 2023-04-06 ENCOUNTER — Other Ambulatory Visit (HOSPITAL_BASED_OUTPATIENT_CLINIC_OR_DEPARTMENT_OTHER): Payer: Self-pay

## 2023-04-07 ENCOUNTER — Other Ambulatory Visit (HOSPITAL_BASED_OUTPATIENT_CLINIC_OR_DEPARTMENT_OTHER): Payer: Self-pay

## 2023-04-07 MED ORDER — TRELEGY ELLIPTA 100-62.5-25 MCG/ACT IN AEPB
1.0000 | INHALATION_SPRAY | Freq: Every day | RESPIRATORY_TRACT | 1 refills | Status: DC
  Filled 2023-04-07: qty 60, 30d supply, fill #0
  Filled 2023-05-18: qty 60, 30d supply, fill #1

## 2023-05-09 ENCOUNTER — Other Ambulatory Visit (HOSPITAL_BASED_OUTPATIENT_CLINIC_OR_DEPARTMENT_OTHER): Payer: Self-pay

## 2023-05-11 ENCOUNTER — Other Ambulatory Visit (HOSPITAL_COMMUNITY): Payer: Self-pay

## 2023-05-18 ENCOUNTER — Other Ambulatory Visit (HOSPITAL_BASED_OUTPATIENT_CLINIC_OR_DEPARTMENT_OTHER): Payer: Self-pay

## 2023-06-21 ENCOUNTER — Other Ambulatory Visit (HOSPITAL_BASED_OUTPATIENT_CLINIC_OR_DEPARTMENT_OTHER): Payer: Self-pay

## 2023-06-22 ENCOUNTER — Other Ambulatory Visit (HOSPITAL_BASED_OUTPATIENT_CLINIC_OR_DEPARTMENT_OTHER): Payer: Self-pay

## 2023-06-22 MED ORDER — TRELEGY ELLIPTA 100-62.5-25 MCG/ACT IN AEPB
1.0000 | INHALATION_SPRAY | Freq: Every day | RESPIRATORY_TRACT | 1 refills | Status: DC
  Filled 2023-06-22: qty 60, 30d supply, fill #0
  Filled 2023-08-01: qty 60, 30d supply, fill #1

## 2023-07-07 ENCOUNTER — Other Ambulatory Visit (HOSPITAL_BASED_OUTPATIENT_CLINIC_OR_DEPARTMENT_OTHER): Payer: Self-pay

## 2023-08-01 ENCOUNTER — Other Ambulatory Visit (HOSPITAL_BASED_OUTPATIENT_CLINIC_OR_DEPARTMENT_OTHER): Payer: Self-pay

## 2023-09-03 ENCOUNTER — Other Ambulatory Visit (HOSPITAL_BASED_OUTPATIENT_CLINIC_OR_DEPARTMENT_OTHER): Payer: Self-pay

## 2023-09-04 ENCOUNTER — Other Ambulatory Visit: Payer: Self-pay

## 2023-09-04 ENCOUNTER — Other Ambulatory Visit (HOSPITAL_BASED_OUTPATIENT_CLINIC_OR_DEPARTMENT_OTHER): Payer: Self-pay

## 2023-09-05 ENCOUNTER — Other Ambulatory Visit (HOSPITAL_BASED_OUTPATIENT_CLINIC_OR_DEPARTMENT_OTHER): Payer: Self-pay

## 2023-09-06 ENCOUNTER — Other Ambulatory Visit (HOSPITAL_BASED_OUTPATIENT_CLINIC_OR_DEPARTMENT_OTHER): Payer: Self-pay

## 2023-09-07 ENCOUNTER — Other Ambulatory Visit (HOSPITAL_BASED_OUTPATIENT_CLINIC_OR_DEPARTMENT_OTHER): Payer: Self-pay

## 2023-09-07 MED ORDER — TRELEGY ELLIPTA 100-62.5-25 MCG/ACT IN AEPB
1.0000 | INHALATION_SPRAY | Freq: Every day | RESPIRATORY_TRACT | 0 refills | Status: DC
  Filled 2023-09-07: qty 60, 30d supply, fill #0

## 2023-09-18 ENCOUNTER — Other Ambulatory Visit (HOSPITAL_BASED_OUTPATIENT_CLINIC_OR_DEPARTMENT_OTHER): Payer: Self-pay

## 2023-09-18 MED ORDER — TRELEGY ELLIPTA 100-62.5-25 MCG/ACT IN AEPB
1.0000 | INHALATION_SPRAY | Freq: Every day | RESPIRATORY_TRACT | 12 refills | Status: DC
  Filled 2023-09-18 – 2024-01-25 (×3): qty 60, 30d supply, fill #0
  Filled 2024-04-09: qty 60, 30d supply, fill #1
  Filled 2024-05-13: qty 60, 30d supply, fill #2
  Filled 2024-06-27: qty 60, 30d supply, fill #3
  Filled 2024-07-24: qty 60, 30d supply, fill #4
  Filled 2024-08-28: qty 60, 30d supply, fill #5

## 2023-10-03 ENCOUNTER — Other Ambulatory Visit (HOSPITAL_BASED_OUTPATIENT_CLINIC_OR_DEPARTMENT_OTHER): Payer: Self-pay

## 2023-11-22 ENCOUNTER — Other Ambulatory Visit (HOSPITAL_BASED_OUTPATIENT_CLINIC_OR_DEPARTMENT_OTHER): Payer: Self-pay

## 2023-11-23 ENCOUNTER — Other Ambulatory Visit (HOSPITAL_BASED_OUTPATIENT_CLINIC_OR_DEPARTMENT_OTHER): Payer: Self-pay

## 2023-11-23 MED ORDER — FOLIC ACID 1 MG PO TABS
1.0000 mg | ORAL_TABLET | Freq: Every day | ORAL | 6 refills | Status: DC
  Filled 2023-11-23: qty 30, 30d supply, fill #0
  Filled 2024-01-12: qty 30, 30d supply, fill #1
  Filled 2024-02-21: qty 30, 30d supply, fill #2
  Filled 2024-04-09: qty 30, 30d supply, fill #3
  Filled 2024-05-13: qty 30, 30d supply, fill #4
  Filled 2024-06-27: qty 30, 30d supply, fill #5
  Filled 2024-07-24: qty 30, 30d supply, fill #6

## 2023-11-27 ENCOUNTER — Other Ambulatory Visit (HOSPITAL_BASED_OUTPATIENT_CLINIC_OR_DEPARTMENT_OTHER): Payer: Self-pay

## 2023-11-28 ENCOUNTER — Other Ambulatory Visit (HOSPITAL_BASED_OUTPATIENT_CLINIC_OR_DEPARTMENT_OTHER): Payer: Self-pay

## 2023-11-28 MED ORDER — DUTASTERIDE 0.5 MG PO CAPS
0.5000 mg | ORAL_CAPSULE | Freq: Every day | ORAL | 3 refills | Status: AC
  Filled 2023-11-28: qty 90, 90d supply, fill #0
  Filled 2024-04-09: qty 90, 90d supply, fill #1
  Filled 2024-07-29: qty 90, 90d supply, fill #2
  Filled 2024-11-08: qty 90, 90d supply, fill #3

## 2023-11-28 MED ORDER — TAMSULOSIN HCL 0.4 MG PO CAPS
0.8000 mg | ORAL_CAPSULE | Freq: Every day | ORAL | 11 refills | Status: AC
  Filled 2023-11-28: qty 60, 30d supply, fill #0
  Filled 2024-01-12: qty 60, 30d supply, fill #1
  Filled 2024-02-21: qty 60, 30d supply, fill #2
  Filled 2024-04-09: qty 60, 30d supply, fill #3
  Filled 2024-05-13: qty 60, 30d supply, fill #4
  Filled 2024-06-27: qty 60, 30d supply, fill #5
  Filled 2024-07-24: qty 60, 30d supply, fill #6
  Filled 2024-08-28: qty 60, 30d supply, fill #7
  Filled 2024-10-02: qty 60, 30d supply, fill #8
  Filled 2024-11-06: qty 60, 30d supply, fill #9

## 2023-12-11 ENCOUNTER — Other Ambulatory Visit (HOSPITAL_BASED_OUTPATIENT_CLINIC_OR_DEPARTMENT_OTHER): Payer: Self-pay

## 2023-12-27 ENCOUNTER — Other Ambulatory Visit (HOSPITAL_BASED_OUTPATIENT_CLINIC_OR_DEPARTMENT_OTHER): Payer: Self-pay

## 2024-01-12 ENCOUNTER — Other Ambulatory Visit (HOSPITAL_BASED_OUTPATIENT_CLINIC_OR_DEPARTMENT_OTHER): Payer: Self-pay

## 2024-01-25 ENCOUNTER — Other Ambulatory Visit: Payer: Self-pay

## 2024-01-25 ENCOUNTER — Other Ambulatory Visit (HOSPITAL_BASED_OUTPATIENT_CLINIC_OR_DEPARTMENT_OTHER): Payer: Self-pay

## 2024-02-21 ENCOUNTER — Other Ambulatory Visit (HOSPITAL_BASED_OUTPATIENT_CLINIC_OR_DEPARTMENT_OTHER): Payer: Self-pay

## 2024-04-09 ENCOUNTER — Other Ambulatory Visit (HOSPITAL_BASED_OUTPATIENT_CLINIC_OR_DEPARTMENT_OTHER): Payer: Self-pay

## 2024-05-13 ENCOUNTER — Other Ambulatory Visit (HOSPITAL_BASED_OUTPATIENT_CLINIC_OR_DEPARTMENT_OTHER): Payer: Self-pay

## 2024-06-28 ENCOUNTER — Other Ambulatory Visit (HOSPITAL_BASED_OUTPATIENT_CLINIC_OR_DEPARTMENT_OTHER): Payer: Self-pay

## 2024-07-24 ENCOUNTER — Other Ambulatory Visit: Payer: Self-pay

## 2024-07-24 ENCOUNTER — Other Ambulatory Visit (HOSPITAL_BASED_OUTPATIENT_CLINIC_OR_DEPARTMENT_OTHER): Payer: Self-pay

## 2024-07-29 ENCOUNTER — Other Ambulatory Visit (HOSPITAL_BASED_OUTPATIENT_CLINIC_OR_DEPARTMENT_OTHER): Payer: Self-pay

## 2024-08-28 ENCOUNTER — Other Ambulatory Visit: Payer: Self-pay

## 2024-08-28 ENCOUNTER — Other Ambulatory Visit (HOSPITAL_BASED_OUTPATIENT_CLINIC_OR_DEPARTMENT_OTHER): Payer: Self-pay

## 2024-08-29 ENCOUNTER — Other Ambulatory Visit (HOSPITAL_BASED_OUTPATIENT_CLINIC_OR_DEPARTMENT_OTHER): Payer: Self-pay

## 2024-10-15 ENCOUNTER — Other Ambulatory Visit (HOSPITAL_BASED_OUTPATIENT_CLINIC_OR_DEPARTMENT_OTHER): Payer: Self-pay

## 2024-11-05 ENCOUNTER — Other Ambulatory Visit (HOSPITAL_BASED_OUTPATIENT_CLINIC_OR_DEPARTMENT_OTHER): Payer: Self-pay

## 2024-11-05 MED ORDER — FOLIC ACID 1 MG PO TABS
1.0000 mg | ORAL_TABLET | Freq: Every day | ORAL | 6 refills | Status: AC
  Filled 2024-11-05: qty 30, 30d supply, fill #0
  Filled 2024-12-03: qty 30, 30d supply, fill #1

## 2024-11-06 ENCOUNTER — Other Ambulatory Visit (HOSPITAL_BASED_OUTPATIENT_CLINIC_OR_DEPARTMENT_OTHER): Payer: Self-pay

## 2024-11-07 ENCOUNTER — Other Ambulatory Visit (HOSPITAL_BASED_OUTPATIENT_CLINIC_OR_DEPARTMENT_OTHER): Payer: Self-pay

## 2024-11-07 ENCOUNTER — Other Ambulatory Visit: Payer: Self-pay

## 2024-11-07 MED ORDER — TRELEGY ELLIPTA 100-62.5-25 MCG/ACT IN AEPB
1.0000 | INHALATION_SPRAY | Freq: Every day | RESPIRATORY_TRACT | 12 refills | Status: AC
  Filled 2024-11-07: qty 60, 30d supply, fill #0
  Filled 2024-12-02: qty 60, 30d supply, fill #1

## 2024-11-08 ENCOUNTER — Other Ambulatory Visit (HOSPITAL_BASED_OUTPATIENT_CLINIC_OR_DEPARTMENT_OTHER): Payer: Self-pay

## 2024-11-12 ENCOUNTER — Other Ambulatory Visit (HOSPITAL_BASED_OUTPATIENT_CLINIC_OR_DEPARTMENT_OTHER): Payer: Self-pay

## 2024-12-02 ENCOUNTER — Other Ambulatory Visit: Payer: Self-pay

## 2024-12-02 ENCOUNTER — Other Ambulatory Visit (HOSPITAL_BASED_OUTPATIENT_CLINIC_OR_DEPARTMENT_OTHER): Payer: Self-pay

## 2024-12-03 ENCOUNTER — Other Ambulatory Visit (HOSPITAL_BASED_OUTPATIENT_CLINIC_OR_DEPARTMENT_OTHER): Payer: Self-pay

## 2024-12-05 ENCOUNTER — Other Ambulatory Visit (HOSPITAL_BASED_OUTPATIENT_CLINIC_OR_DEPARTMENT_OTHER): Payer: Self-pay

## 2024-12-05 MED ORDER — TAMSULOSIN HCL 0.4 MG PO CAPS
0.8000 mg | ORAL_CAPSULE | Freq: Every day | ORAL | 0 refills | Status: AC
  Filled 2024-12-05: qty 180, 90d supply, fill #0

## 2024-12-06 ENCOUNTER — Other Ambulatory Visit (HOSPITAL_BASED_OUTPATIENT_CLINIC_OR_DEPARTMENT_OTHER): Payer: Self-pay

## 2024-12-06 MED ORDER — FOLIC ACID 1 MG PO TABS: 1.0000 mg | ORAL_TABLET | Freq: Every day | ORAL | 6 refills | Status: AC
